# Patient Record
Sex: Female | Born: 1950
Health system: Southern US, Community
[De-identification: ages and names within clinical notes are randomized; demographics above are authoritative.]

## PROBLEM LIST (undated history)

## (undated) DIAGNOSIS — Z8719 Personal history of other diseases of the digestive system: Secondary | ICD-10-CM

## (undated) DIAGNOSIS — K449 Diaphragmatic hernia without obstruction or gangrene: Secondary | ICD-10-CM

## (undated) DIAGNOSIS — C541 Malignant neoplasm of endometrium: Secondary | ICD-10-CM

## (undated) DIAGNOSIS — K219 Gastro-esophageal reflux disease without esophagitis: Secondary | ICD-10-CM

## (undated) DIAGNOSIS — H269 Unspecified cataract: Secondary | ICD-10-CM

## (undated) DIAGNOSIS — K5900 Constipation, unspecified: Secondary | ICD-10-CM

## (undated) DIAGNOSIS — R232 Flushing: Secondary | ICD-10-CM

## (undated) DIAGNOSIS — K5792 Diverticulitis of intestine, part unspecified, without perforation or abscess without bleeding: Secondary | ICD-10-CM

## (undated) DIAGNOSIS — B019 Varicella without complication: Secondary | ICD-10-CM

## (undated) DIAGNOSIS — K635 Polyp of colon: Secondary | ICD-10-CM

## (undated) DIAGNOSIS — Z9889 Other specified postprocedural states: Secondary | ICD-10-CM

## (undated) DIAGNOSIS — K8020 Calculus of gallbladder without cholecystitis without obstruction: Secondary | ICD-10-CM

## (undated) DIAGNOSIS — M1990 Unspecified osteoarthritis, unspecified site: Secondary | ICD-10-CM

## (undated) DIAGNOSIS — C55 Malignant neoplasm of uterus, part unspecified: Secondary | ICD-10-CM

## (undated) DIAGNOSIS — R12 Heartburn: Secondary | ICD-10-CM

## (undated) DIAGNOSIS — K602 Anal fissure, unspecified: Secondary | ICD-10-CM

## (undated) DIAGNOSIS — R112 Nausea with vomiting, unspecified: Secondary | ICD-10-CM

## (undated) DIAGNOSIS — E785 Hyperlipidemia, unspecified: Secondary | ICD-10-CM

## (undated) HISTORY — DX: Calculus of gallbladder without cholecystitis without obstruction: K80.20

## (undated) HISTORY — DX: Personal history of other diseases of the digestive system: Z87.19

## (undated) HISTORY — DX: Polyp of colon: K63.5

## (undated) HISTORY — DX: Diverticulitis of intestine, part unspecified, without perforation or abscess without bleeding: K57.92

## (undated) HISTORY — DX: Hyperlipidemia, unspecified: E78.5

## (undated) HISTORY — PX: OTHER SURGICAL HISTORY: SHX169

## (undated) HISTORY — PX: UPPER GASTROINTESTINAL ENDOSCOPY: SHX188

## (undated) HISTORY — DX: Diaphragmatic hernia without obstruction or gangrene: K44.9

## (undated) HISTORY — PX: NASAL SINUS SURGERY: SHX719

## (undated) HISTORY — DX: Heartburn: R12

## (undated) HISTORY — PX: BREAST BIOPSY: SHX20

## (undated) HISTORY — DX: Anal fissure, unspecified: K60.2

## (undated) HISTORY — DX: Other specified postprocedural states: Z98.890

## (undated) HISTORY — DX: Unspecified osteoarthritis, unspecified site: M19.90

## (undated) HISTORY — DX: Malignant neoplasm of endometrium: C54.1

## (undated) HISTORY — DX: Malignant neoplasm of uterus, part unspecified: C55

## (undated) HISTORY — DX: Flushing: R23.2

## (undated) HISTORY — PX: BREAST CYST ASPIRATION: SHX578

## (undated) HISTORY — DX: Constipation, unspecified: K59.00

## (undated) HISTORY — DX: Varicella without complication: B01.9

## (undated) HISTORY — DX: Unspecified cataract: H26.9

## (undated) HISTORY — PX: POLYPECTOMY: SHX149

---

## 1971-04-13 HISTORY — PX: HEMORROIDECTOMY: SUR656

## 1982-04-12 HISTORY — PX: CHOLECYSTECTOMY: SHX55

## 1998-04-12 HISTORY — PX: TOTAL ABDOMINAL HYSTERECTOMY: SHX209

## 1998-04-12 HISTORY — PX: APPENDECTOMY: SHX54

## 2010-04-12 HISTORY — PX: REPLACEMENT TOTAL KNEE: SUR1224

## 2011-04-13 HISTORY — PX: REPLACEMENT TOTAL KNEE: SUR1224

## 2013-09-17 DIAGNOSIS — H359 Unspecified retinal disorder: Secondary | ICD-10-CM | POA: Diagnosis not present

## 2013-09-17 DIAGNOSIS — H2510 Age-related nuclear cataract, unspecified eye: Secondary | ICD-10-CM | POA: Diagnosis not present

## 2013-09-17 DIAGNOSIS — H18559 Macular corneal dystrophy, unspecified eye: Secondary | ICD-10-CM | POA: Diagnosis not present

## 2013-09-17 DIAGNOSIS — H18599 Other hereditary corneal dystrophies, unspecified eye: Secondary | ICD-10-CM | POA: Diagnosis not present

## 2013-11-01 DIAGNOSIS — K219 Gastro-esophageal reflux disease without esophagitis: Secondary | ICD-10-CM | POA: Diagnosis not present

## 2013-11-01 DIAGNOSIS — E782 Mixed hyperlipidemia: Secondary | ICD-10-CM | POA: Diagnosis not present

## 2013-11-01 DIAGNOSIS — L608 Other nail disorders: Secondary | ICD-10-CM | POA: Diagnosis not present

## 2013-11-01 DIAGNOSIS — M1710 Unilateral primary osteoarthritis, unspecified knee: Secondary | ICD-10-CM | POA: Diagnosis not present

## 2013-12-18 DIAGNOSIS — H2510 Age-related nuclear cataract, unspecified eye: Secondary | ICD-10-CM | POA: Diagnosis not present

## 2013-12-18 DIAGNOSIS — H18559 Macular corneal dystrophy, unspecified eye: Secondary | ICD-10-CM | POA: Diagnosis not present

## 2013-12-18 DIAGNOSIS — H359 Unspecified retinal disorder: Secondary | ICD-10-CM | POA: Diagnosis not present

## 2014-03-18 DIAGNOSIS — E782 Mixed hyperlipidemia: Secondary | ICD-10-CM | POA: Diagnosis not present

## 2014-03-18 DIAGNOSIS — Z0000 Encounter for general adult medical examination without abnormal findings: Secondary | ICD-10-CM | POA: Diagnosis not present

## 2014-03-18 DIAGNOSIS — Z23 Encounter for immunization: Secondary | ICD-10-CM | POA: Diagnosis not present

## 2014-03-18 DIAGNOSIS — Z1329 Encounter for screening for other suspected endocrine disorder: Secondary | ICD-10-CM | POA: Diagnosis not present

## 2014-05-01 DIAGNOSIS — H2513 Age-related nuclear cataract, bilateral: Secondary | ICD-10-CM | POA: Diagnosis not present

## 2014-05-01 DIAGNOSIS — H3589 Other specified retinal disorders: Secondary | ICD-10-CM | POA: Diagnosis not present

## 2014-05-01 DIAGNOSIS — H18453 Nodular corneal degeneration, bilateral: Secondary | ICD-10-CM | POA: Diagnosis not present

## 2014-05-23 DIAGNOSIS — Z779 Other contact with and (suspected) exposures hazardous to health: Secondary | ICD-10-CM | POA: Diagnosis not present

## 2014-05-23 DIAGNOSIS — L905 Scar conditions and fibrosis of skin: Secondary | ICD-10-CM | POA: Diagnosis not present

## 2014-05-23 DIAGNOSIS — Z803 Family history of malignant neoplasm of breast: Secondary | ICD-10-CM | POA: Diagnosis not present

## 2014-05-23 DIAGNOSIS — Z1231 Encounter for screening mammogram for malignant neoplasm of breast: Secondary | ICD-10-CM | POA: Diagnosis not present

## 2014-06-06 DIAGNOSIS — J219 Acute bronchiolitis, unspecified: Secondary | ICD-10-CM | POA: Diagnosis not present

## 2014-06-06 DIAGNOSIS — J209 Acute bronchitis, unspecified: Secondary | ICD-10-CM | POA: Diagnosis not present

## 2014-11-11 DIAGNOSIS — K5909 Other constipation: Secondary | ICD-10-CM | POA: Diagnosis not present

## 2014-11-11 DIAGNOSIS — F430 Acute stress reaction: Secondary | ICD-10-CM | POA: Diagnosis not present

## 2014-11-18 DIAGNOSIS — K5730 Diverticulosis of large intestine without perforation or abscess without bleeding: Secondary | ICD-10-CM | POA: Diagnosis not present

## 2014-11-18 DIAGNOSIS — K219 Gastro-esophageal reflux disease without esophagitis: Secondary | ICD-10-CM | POA: Diagnosis not present

## 2014-11-18 DIAGNOSIS — R1314 Dysphagia, pharyngoesophageal phase: Secondary | ICD-10-CM | POA: Diagnosis not present

## 2014-12-02 DIAGNOSIS — D120 Benign neoplasm of cecum: Secondary | ICD-10-CM | POA: Diagnosis not present

## 2014-12-02 DIAGNOSIS — K5900 Constipation, unspecified: Secondary | ICD-10-CM | POA: Diagnosis not present

## 2014-12-02 DIAGNOSIS — K621 Rectal polyp: Secondary | ICD-10-CM | POA: Diagnosis not present

## 2014-12-02 DIAGNOSIS — R1310 Dysphagia, unspecified: Secondary | ICD-10-CM | POA: Diagnosis not present

## 2014-12-02 DIAGNOSIS — R109 Unspecified abdominal pain: Secondary | ICD-10-CM | POA: Diagnosis not present

## 2014-12-02 DIAGNOSIS — R1031 Right lower quadrant pain: Secondary | ICD-10-CM | POA: Diagnosis not present

## 2014-12-02 DIAGNOSIS — K2950 Unspecified chronic gastritis without bleeding: Secondary | ICD-10-CM | POA: Diagnosis not present

## 2014-12-02 DIAGNOSIS — K209 Esophagitis, unspecified: Secondary | ICD-10-CM | POA: Diagnosis not present

## 2014-12-02 DIAGNOSIS — K210 Gastro-esophageal reflux disease with esophagitis: Secondary | ICD-10-CM | POA: Diagnosis not present

## 2014-12-02 DIAGNOSIS — K219 Gastro-esophageal reflux disease without esophagitis: Secondary | ICD-10-CM | POA: Diagnosis not present

## 2014-12-02 DIAGNOSIS — K635 Polyp of colon: Secondary | ICD-10-CM | POA: Diagnosis not present

## 2014-12-02 DIAGNOSIS — K5730 Diverticulosis of large intestine without perforation or abscess without bleeding: Secondary | ICD-10-CM | POA: Diagnosis not present

## 2015-01-27 DIAGNOSIS — E782 Mixed hyperlipidemia: Secondary | ICD-10-CM | POA: Diagnosis not present

## 2015-01-27 DIAGNOSIS — Z23 Encounter for immunization: Secondary | ICD-10-CM | POA: Diagnosis not present

## 2015-01-28 DIAGNOSIS — E782 Mixed hyperlipidemia: Secondary | ICD-10-CM | POA: Diagnosis not present

## 2015-01-28 LAB — LIPID PANEL
Cholesterol: 191 mg/dL (ref 0–200)
Cholesterol: 191 mg/dL (ref 0–200)
HDL: 48 mg/dL (ref 35–70)
HDL: 48 mg/dL (ref 35–70)
LDL Cholesterol: 121 mg/dL
LDL Cholesterol: 121 mg/dL
TRIGLYCERIDES: 110 mg/dL (ref 40–160)
Triglycerides: 110 mg/dL (ref 40–160)

## 2015-01-28 LAB — HEPATIC FUNCTION PANEL
ALK PHOS: 83 U/L (ref 25–125)
ALT: 13 U/L (ref 7–35)
ALT: 13 U/L (ref 7–35)
AST: 14 U/L (ref 13–35)
AST: 14 U/L (ref 13–35)
Alkaline Phosphatase: 83 U/L (ref 25–125)
BILIRUBIN, TOTAL: 0.6 mg/dL
Bilirubin, Direct: 0.1 mg/dL (ref 0.01–0.4)
Bilirubin, Direct: 0.1 mg/dL (ref 0.01–0.4)
Bilirubin, Total: 0.6 mg/dL

## 2015-03-13 DIAGNOSIS — L820 Inflamed seborrheic keratosis: Secondary | ICD-10-CM | POA: Diagnosis not present

## 2015-03-13 DIAGNOSIS — D492 Neoplasm of unspecified behavior of bone, soft tissue, and skin: Secondary | ICD-10-CM | POA: Diagnosis not present

## 2015-06-26 ENCOUNTER — Ambulatory Visit (INDEPENDENT_AMBULATORY_CARE_PROVIDER_SITE_OTHER): Payer: Medicare Other | Admitting: Family Medicine

## 2015-06-26 ENCOUNTER — Encounter: Payer: Self-pay | Admitting: Family Medicine

## 2015-06-26 VITALS — BP 126/88 | HR 77 | Temp 98.0°F | Ht 66.0 in | Wt 165.5 lb

## 2015-06-26 DIAGNOSIS — N951 Menopausal and female climacteric states: Secondary | ICD-10-CM

## 2015-06-26 DIAGNOSIS — E785 Hyperlipidemia, unspecified: Secondary | ICD-10-CM

## 2015-06-26 DIAGNOSIS — Z130 Encounter for screening for diseases of the blood and blood-forming organs and certain disorders involving the immune mechanism: Secondary | ICD-10-CM

## 2015-06-26 DIAGNOSIS — M170 Bilateral primary osteoarthritis of knee: Secondary | ICD-10-CM | POA: Diagnosis not present

## 2015-06-26 DIAGNOSIS — R232 Flushing: Secondary | ICD-10-CM

## 2015-06-26 DIAGNOSIS — Z23 Encounter for immunization: Secondary | ICD-10-CM

## 2015-06-26 DIAGNOSIS — K5900 Constipation, unspecified: Secondary | ICD-10-CM

## 2015-06-26 DIAGNOSIS — Z8542 Personal history of malignant neoplasm of other parts of uterus: Secondary | ICD-10-CM

## 2015-06-26 DIAGNOSIS — M1990 Unspecified osteoarthritis, unspecified site: Secondary | ICD-10-CM | POA: Insufficient documentation

## 2015-06-26 DIAGNOSIS — K219 Gastro-esophageal reflux disease without esophagitis: Secondary | ICD-10-CM

## 2015-06-26 NOTE — Patient Instructions (Signed)
Stop the amitiza.  Continue your other medications.   We will call your lab results.  Please be sure to get her mammogram.  Follow up annually  Take care  Dr. Lacinda Axon Health Maintenance, Female Adopting a healthy lifestyle and getting preventive care can go a long way to promote health and wellness. Talk with your health care provider about what schedule of regular examinations is right for you. This is a good chance for you to check in with your provider about disease prevention and staying healthy. In between checkups, there are plenty of things you can do on your own. Experts have done a lot of research about which lifestyle changes and preventive measures are most likely to keep you healthy. Ask your health care provider for more information. WEIGHT AND DIET  Eat a healthy diet  Be sure to include plenty of vegetables, fruits, low-fat dairy products, and lean protein.  Do not eat a lot of foods high in solid fats, added sugars, or salt.  Get regular exercise. This is one of the most important things you can do for your health.  Most adults should exercise for at least 150 minutes each week. The exercise should increase your heart rate and make you sweat (moderate-intensity exercise).  Most adults should also do strengthening exercises at least twice a week. This is in addition to the moderate-intensity exercise.  Maintain a healthy weight  Body mass index (BMI) is a measurement that can be used to identify possible weight problems. It estimates body fat based on height and weight. Your health care provider can help determine your BMI and help you achieve or maintain a healthy weight.  For females 71 years of age and older:   A BMI below 18.5 is considered underweight.  A BMI of 18.5 to 24.9 is normal.  A BMI of 25 to 29.9 is considered overweight.  A BMI of 30 and above is considered obese.  Watch levels of cholesterol and blood lipids  You should start having your blood  tested for lipids and cholesterol at 67 years of age, then have this test every 5 years.  You may need to have your cholesterol levels checked more often if:  Your lipid or cholesterol levels are high.  You are older than 67 years of age.  You are at high risk for heart disease.  CANCER SCREENING   Lung Cancer  Lung cancer screening is recommended for adults 31-39 years old who are at high risk for lung cancer because of a history of smoking.  A yearly low-dose CT scan of the lungs is recommended for people who:  Currently smoke.  Have quit within the past 15 years.  Have at least a 30-pack-year history of smoking. A pack year is smoking an average of one pack of cigarettes a day for 1 year.  Yearly screening should continue until it has been 15 years since you quit.  Yearly screening should stop if you develop a health problem that would prevent you from having lung cancer treatment.  Breast Cancer  Practice breast self-awareness. This [place] understanding how your breasts normally appear and feel.  It also [place] doing regular breast self-exams. Let your health care provider know about any changes, no matter how small.  If you are in your 20s or 30s, you should have a clinical breast exam (CBE) by a health care provider every 1-3 years as part of a regular health exam.  If you are 7 or older, have a  CBE every year. Also consider having a breast X-ray (mammogram) every year.  If you have a family history of breast cancer, talk to your health care provider about genetic screening.  If you are at high risk for breast cancer, talk to your health care provider about having an MRI and a mammogram every year.  Breast cancer gene (BRCA) assessment is recommended for women who have family members with BRCA-related cancers. BRCA-related cancers include:  Breast.  Ovarian.  Tubal.  Peritoneal cancers.  Results of the assessment will determine the need for genetic counseling  and BRCA1 and BRCA2 testing. Cervical Cancer Your health care provider may recommend that you be screened regularly for cancer of the pelvic organs (ovaries, uterus, and vagina). This screening involves a pelvic examination, including checking for microscopic changes to the surface of your cervix (Pap test). You may be encouraged to have this screening done every 3 years, beginning at age 41.  For women ages 36-65, health care providers may recommend pelvic exams and Pap testing every 3 years, or they may recommend the Pap and pelvic exam, combined with testing for human papilloma virus (HPV), every 5 years. Some types of HPV increase your risk of cervical cancer. Testing for HPV may also be done on women of any age with unclear Pap test results.  Other health care providers may not recommend any screening for nonpregnant women who are considered low risk for pelvic cancer and who do not have symptoms. Ask your health care provider if a screening pelvic exam is right for you.  If you have had past treatment for cervical cancer or a condition that could lead to cancer, you need Pap tests and screening for cancer for at least 20 years after your treatment. If Pap tests have been discontinued, your risk factors (such as having a new sexual partner) need to be reassessed to determine if screening should resume. Some women have medical problems that increase the chance of getting cervical cancer. In these cases, your health care provider may recommend more frequent screening and Pap tests. Colorectal Cancer  This type of cancer can be detected and often prevented.  Routine colorectal cancer screening usually begins at 67 years of age and continues through 67 years of age.  Your health care provider may recommend screening at an earlier age if you have risk factors for colon cancer.  Your health care provider may also recommend using home test kits to check for hidden blood in the stool.  A small camera  at the end of a tube can be used to examine your colon directly (sigmoidoscopy or colonoscopy). This is done to check for the earliest forms of colorectal cancer.  Routine screening usually begins at age 52.  Direct examination of the colon should be repeated every 5-10 years through 67 years of age. However, you may need to be screened more often if early forms of precancerous polyps or small growths are found. Skin Cancer  Check your skin from head to toe regularly.  Tell your health care provider about any new moles or changes in moles, especially if there is a change in a mole's shape or color.  Also tell your health care provider if you have a mole that is larger than the size of a pencil eraser.  Always use sunscreen. Apply sunscreen liberally and repeatedly throughout the day.  Protect yourself by wearing long sleeves, pants, a wide-brimmed hat, and sunglasses whenever you are outside. HEART DISEASE, DIABETES, AND HIGH BLOOD  PRESSURE   High blood pressure causes heart disease and increases the risk of stroke. High blood pressure is more likely to develop in:  People who have blood pressure in the high end of the normal range (130-139/85-89 mm Hg).  People who are overweight or obese.  People who are African American.  If you are 18-39 years of age, have your blood pressure checked every 3-5 years. If you are 40 years of age or older, have your blood pressure checked every year. You should have your blood pressure measured twice--once when you are at a hospital or clinic, and once when you are not at a hospital or clinic. Record the average of the two measurements. To check your blood pressure when you are not at a hospital or clinic, you can use:  An automated blood pressure machine at a pharmacy.  A home blood pressure monitor.  If you are between 55 years and 79 years old, ask your health care provider if you should take aspirin to prevent strokes.  Have regular diabetes  screenings. This involves taking a blood sample to check your fasting blood sugar level.  If you are at a normal weight and have a low risk for diabetes, have this test once every three years after 67 years of age.  If you are overweight and have a high risk for diabetes, consider being tested at a younger age or more often. PREVENTING INFECTION  Hepatitis B  If you have a higher risk for hepatitis B, you should be screened for this virus. You are considered at high risk for hepatitis B if:  You were born in a country where hepatitis B is common. Ask your health care provider which countries are considered high risk.  Your parents were born in a high-risk country, and you have not been immunized against hepatitis B (hepatitis B vaccine).  You have HIV or AIDS.  You use needles to inject street drugs.  You live with someone who has hepatitis B.  You have had sex with someone who has hepatitis B.  You get hemodialysis treatment.  You take certain medicines for conditions, including cancer, organ transplantation, and autoimmune conditions. Hepatitis C  Blood testing is recommended for:  Everyone born from 1945 through 1965.  Anyone with known risk factors for hepatitis C. Sexually transmitted infections (STIs)  You should be screened for sexually transmitted infections (STIs) including gonorrhea and chlamydia if:  You are sexually active and are younger than 67 years of age.  You are older than 67 years of age and your health care provider tells you that you are at risk for this type of infection.  Your sexual activity has changed since you were last screened and you are at an increased risk for chlamydia or gonorrhea. Ask your health care provider if you are at risk.  If you do not have HIV, but are at risk, it may be recommended that you take a prescription medicine daily to prevent HIV infection. This is called pre-exposure prophylaxis (PrEP). You are considered at risk  if:  You are sexually active and do not regularly use condoms or know the HIV status of your partner(s).  You take drugs by injection.  You are sexually active with a partner who has HIV. Talk with your health care provider about whether you are at high risk of being infected with HIV. If you choose to begin PrEP, you should first be tested for HIV. You should then be tested every 3   months for as long as you are taking PrEP.  PREGNANCY   If you are premenopausal and you may become pregnant, ask your health care provider about preconception counseling.  If you may become pregnant, take 400 to 800 micrograms (mcg) of folic acid every day.  If you want to prevent pregnancy, talk to your health care provider about birth control (contraception). OSTEOPOROSIS AND MENOPAUSE   Osteoporosis is a disease in which the bones lose minerals and strength with aging. This can result in serious bone fractures. Your risk for osteoporosis can be identified using a bone density scan.  If you are 47 years of age or older, or if you are at risk for osteoporosis and fractures, ask your health care provider if you should be screened.  Ask your health care provider whether you should take a calcium or vitamin D supplement to lower your risk for osteoporosis.  Menopause may have certain physical symptoms and risks.  Hormone replacement therapy may reduce some of these symptoms and risks. Talk to your health care provider about whether hormone replacement therapy is right for you.  HOME CARE INSTRUCTIONS   Schedule regular health, dental, and eye exams.  Stay current with your immunizations.   Do not use any tobacco products including cigarettes, chewing tobacco, or electronic cigarettes.  If you are pregnant, do not drink alcohol.  If you are breastfeeding, limit how much and how often you drink alcohol.  Limit alcohol intake to no more than 1 drink per day for nonpregnant women. One drink equals 12  ounces of beer, 5 ounces of wine, or 1 ounces of hard liquor.  Do not use street drugs.  Do not share needles.  Ask your health care provider for help if you need support or information about quitting drugs.  Tell your health care provider if you often feel depressed.  Tell your health care provider if you have ever been abused or do not feel safe at home.   This information is not intended to replace advice given to you by your health care provider. Make sure you discuss any questions you have with your health care provider.   Document Released: 10/12/2010 Document Revised: 04/19/2014 Document Reviewed: 02/28/2013 Elsevier Interactive Patient Education [employer].

## 2015-06-26 NOTE — Progress Notes (Signed)
Pre visit review using our clinic review tool, if applicable. No additional management support is needed unless otherwise documented below in the visit note. 

## 2015-06-27 ENCOUNTER — Encounter: Payer: Self-pay | Admitting: Family Medicine

## 2015-06-27 DIAGNOSIS — K5900 Constipation, unspecified: Secondary | ICD-10-CM | POA: Insufficient documentation

## 2015-06-27 DIAGNOSIS — E785 Hyperlipidemia, unspecified: Secondary | ICD-10-CM | POA: Insufficient documentation

## 2015-06-27 DIAGNOSIS — K5909 Other constipation: Secondary | ICD-10-CM | POA: Insufficient documentation

## 2015-06-27 DIAGNOSIS — K219 Gastro-esophageal reflux disease without esophagitis: Secondary | ICD-10-CM | POA: Insufficient documentation

## 2015-06-27 DIAGNOSIS — R232 Flushing: Secondary | ICD-10-CM | POA: Insufficient documentation

## 2015-06-27 LAB — CBC
HCT: 44.7 % (ref 36.0–46.0)
Hemoglobin: 15.1 g/dL — ABNORMAL HIGH (ref 12.0–15.0)
MCHC: 33.8 g/dL (ref 30.0–36.0)
MCV: 89 fl (ref 78.0–100.0)
PLATELETS: 206 10*3/uL (ref 150.0–400.0)
RBC: 5.02 Mil/uL (ref 3.87–5.11)
RDW: 12.3 % (ref 11.5–15.5)
WBC: 4.9 10*3/uL (ref 4.0–10.5)

## 2015-06-27 LAB — COMPREHENSIVE METABOLIC PANEL
ALT: 17 U/L (ref 0–35)
AST: 15 U/L (ref 0–37)
Albumin: 4.4 g/dL (ref 3.5–5.2)
Alkaline Phosphatase: 80 U/L (ref 39–117)
BILIRUBIN TOTAL: 0.6 mg/dL (ref 0.2–1.2)
BUN: 9 mg/dL (ref 6–23)
CALCIUM: 9.7 mg/dL (ref 8.4–10.5)
CHLORIDE: 101 meq/L (ref 96–112)
CO2: 33 meq/L — AB (ref 19–32)
CREATININE: 0.75 mg/dL (ref 0.40–1.20)
GFR: 81.99 mL/min (ref >60.00)
GLUCOSE: 92 mg/dL (ref 70–99)
Potassium: 3.8 mEq/L (ref 3.5–5.1)
SODIUM: 141 meq/L (ref 135–145)
Total Protein: 7.2 g/dL (ref 6.0–8.3)

## 2015-06-27 LAB — LIPID PANEL
CHOL/HDL RATIO: 4
Cholesterol: 189 mg/dL (ref 0–200)
HDL: 50.4 mg/dL (ref >39.00)
LDL CALC: 112 mg/dL — AB (ref 0–99)
NONHDL: 138.28
TRIGLYCERIDES: 129 mg/dL (ref 0.0–149.0)
VLDL: 25.8 mg/dL (ref 0.0–40.0)

## 2015-06-27 NOTE — Assessment & Plan Note (Signed)
Stable. Discussed risks of hormone replacement therapy. Patient is aware the risk and would like to continue. I encouraged the patient continue cutting back.

## 2015-06-27 NOTE — Assessment & Plan Note (Signed)
Improving. I suggested a trial off her Amitiza.

## 2015-06-27 NOTE — Assessment & Plan Note (Signed)
Stable on Omeprazole.  

## 2015-06-27 NOTE — Assessment & Plan Note (Signed)
In need of better control. Checking lipid panel today. Discussed switch to high potency statin and patient refused.

## 2015-06-27 NOTE — Assessment & Plan Note (Signed)
S/p hysterectomy and oophorectomy. Patient was monitored for 5 years. No need for additional Pap smears at this time.

## 2015-06-27 NOTE — Progress Notes (Signed)
Subjective:  Patient ID: Jessica Chavez, female    DOB: [birth year]  Age: 65 y.o. MRN: [id number]  CC: Establish care  HPI Rosalina Dingwall Totzke is a 65 y.o. female presents to the clinic today to establish care. Issues are below.  HLD  Patient has her most recent labs.   Cholesterol not at goal.  Currently on pravastatin.  Will discuss today.  Hot flashes  Stable on estrogen patch.  Has been cutting back slowly but does not want to come off.  Constipation  Patient has had a issue with constipation since her husband died.  She has recently had improvement and would like to discuss stopping her current medication (Amitiza).  GERD  Stable/well controlled on omeprazole.  PMH, Surgical Hx, Family Hx, Social History reviewed and updated as below.  Past Medical History  Diagnosis Date  . Osteoarthritis   . Chicken pox   . Uterine cancer ([place])   . Diverticulitis   . Hyperlipidemia   . Heartburn   . Colon polyps   . Constipation   . Hot flashes    Past Surgical History  Procedure Laterality Date  . Cholecystectomy  1984  . Breast biopsy    . Appendectomy  2000  . Abdominal hysterectomy  2000  . Oophorectomy    . Replacement total knee Left 2012  . Replacement total knee Right 2013  . Hemorroidectomy  1973   Family History  Problem Relation Age of Onset  . Endometriosis Mother   . Breast cancer Mother   . Hyperlipidemia Mother   . Hyperlipidemia Father   . Heart disease Father   . Heart disease Maternal Grandmother   . Heart disease Maternal Grandfather   . Hypertension Father   . Lymphoma Mother   . Osteoarthritis Mother   . COPD Mother   . Cataracts Mother   . Glaucoma Mother    Social History  Substance Use Topics  . Smoking status: Never Smoker   . Smokeless tobacco: Never Used  . Alcohol Use: No   Review of Systems  Eyes: Positive for visual disturbance.  Skin:       Mole.  All other systems reviewed and are negative.   Objective:    Today's Vitals: BP 126/88 mmHg  Pulse 77  Temp(Src) 98 F (36.7 C) (Oral)  Ht _0  (1.676 m)  Wt 165 lb 8 oz (75.07 kg)  BMI 26.73 kg/m2  SpO2 98%  Physical Exam  Constitutional: She is oriented to person, place, and time. She appears well-developed and well-nourished. No distress.  HENT:  Head: Normocephalic and atraumatic.  Nose: Nose normal.  Mouth/Throat: Oropharynx is clear and moist. No oropharyngeal exudate.  Normal TM's bilaterally.   Eyes: Conjunctivae are normal. No scleral icterus.  Neck: Neck supple. No thyromegaly present.  Cardiovascular: Normal rate and regular rhythm.   No murmur heard. Pulmonary/Chest: Effort normal and breath sounds normal. She has no wheezes. She has no rales.  Abdominal: Soft. She exhibits no distension. There is no tenderness. There is no rebound and no guarding.  Musculoskeletal: Normal range of motion. She exhibits no edema.  Lymphadenopathy:    She has no cervical adenopathy.  Neurological: She is alert and oriented to person, place, and time.  Skin: Skin is warm and dry. No rash noted.  Psychiatric:  Flat affect.  Vitals reviewed.   Assessment & Plan:   Problem List Items Addressed This Visit    Osteoarthritis   Hyperlipidemia - Primary  In need of better control. Checking lipid panel today. Discussed switch to high potency statin and patient refused.      Relevant Medications   pravastatin (PRAVACHOL) 20 MG tablet   Other Relevant Orders   Lipid Profile (Completed)   Hot flashes    Stable. Discussed risks of hormone replacement therapy. Patient is aware the risk and would like to continue. I encouraged the patient continue cutting back.      History of uterine cancer    S/p hysterectomy and oophorectomy. Patient was monitored for 5 years. No need for additional Pap smears at this time.      GERD (gastroesophageal reflux disease)    Stable on Omeprazole.       Relevant Medications   AMITIZA 8 MCG capsule    omeprazole (PRILOSEC) 20 MG capsule   Constipation    Improving. I suggested a trial off her Amitiza.       Relevant Orders   Comp Met (CMET) (Completed)    Other Visit Diagnoses    Screening for deficiency anemia        Relevant Orders    CBC (Completed)    Need for prophylactic vaccination against Streptococcus pneumoniae (pneumococcus)        Relevant Orders    Pneumococcal conjugate vaccine 13-valent (Completed)       Outpatient Encounter Prescriptions as of 06/26/2015  Medication Sig  . AMITIZA 8 MCG capsule Take 8 mcg by mouth once.  . Calcium Carbonate-Vit D-Min (CALCIUM 1200 PO) Take by mouth.  . Cyanocobalamin (B-12 PO) Take by mouth.  . estradiol (VIVELLE-DOT) 0.025 MG/24HR Place 1 patch onto the skin 2 (two) times a week. Patient is only taking once a week.  . LUTEIN PO Take by mouth.  . Multiple Vitamin (MULTIVITAMIN) tablet Take 1 tablet by mouth daily.  Marland Kitchen omeprazole (PRILOSEC) 20 MG capsule Take 20 mg by mouth daily.  . pravastatin (PRAVACHOL) 20 MG tablet Take 20 mg by mouth once.   No facility-administered encounter medications on file as of 06/26/2015.    Follow-up: 1 year  [place]

## 2015-06-30 ENCOUNTER — Encounter: Payer: Self-pay | Admitting: Family Medicine

## 2015-07-10 ENCOUNTER — Other Ambulatory Visit: Payer: Self-pay | Admitting: Family Medicine

## 2015-07-10 ENCOUNTER — Encounter: Payer: Self-pay | Admitting: Family Medicine

## 2015-07-10 MED ORDER — AMITIZA 8 MCG PO CAPS: 8.0000 ug | ORAL_CAPSULE | Freq: Once | ORAL | Status: DC

## 2015-07-10 MED ORDER — PRAVASTATIN SODIUM 20 MG PO TABS: 20.0000 mg | ORAL_TABLET | Freq: Once | ORAL | Status: DC

## 2015-07-14 ENCOUNTER — Other Ambulatory Visit: Payer: Self-pay

## 2015-07-14 MED ORDER — AMITIZA 8 MCG PO CAPS: 8.0000 ug | ORAL_CAPSULE | Freq: Every day | ORAL | Status: DC

## 2015-07-14 MED ORDER — PRAVASTATIN SODIUM 20 MG PO TABS: 20.0000 mg | ORAL_TABLET | Freq: Every day | ORAL | Status: DC

## 2015-08-05 DIAGNOSIS — Z6826 Body mass index (BMI) 26.0-26.9, adult: Secondary | ICD-10-CM | POA: Diagnosis not present

## 2015-08-05 DIAGNOSIS — Z124 Encounter for screening for malignant neoplasm of cervix: Secondary | ICD-10-CM | POA: Diagnosis not present

## 2015-08-05 DIAGNOSIS — Z01419 Encounter for gynecological examination (general) (routine) without abnormal findings: Secondary | ICD-10-CM | POA: Diagnosis not present

## 2015-08-12 ENCOUNTER — Encounter: Payer: Self-pay | Admitting: Family Medicine

## 2015-08-14 DIAGNOSIS — N958 Other specified menopausal and perimenopausal disorders: Secondary | ICD-10-CM | POA: Diagnosis not present

## 2015-08-14 DIAGNOSIS — Z1231 Encounter for screening mammogram for malignant neoplasm of breast: Secondary | ICD-10-CM | POA: Diagnosis not present

## 2015-08-25 DIAGNOSIS — M5441 Lumbago with sciatica, right side: Secondary | ICD-10-CM | POA: Diagnosis not present

## 2015-08-25 DIAGNOSIS — M25561 Pain in right knee: Secondary | ICD-10-CM | POA: Diagnosis not present

## 2015-08-25 DIAGNOSIS — M25562 Pain in left knee: Secondary | ICD-10-CM | POA: Diagnosis not present

## 2015-09-29 DIAGNOSIS — M8580 Other specified disorders of bone density and structure, unspecified site: Secondary | ICD-10-CM | POA: Diagnosis not present

## 2015-09-29 DIAGNOSIS — M791 Myalgia: Secondary | ICD-10-CM | POA: Diagnosis not present

## 2015-09-29 DIAGNOSIS — M5431 Sciatica, right side: Secondary | ICD-10-CM | POA: Diagnosis not present

## 2015-09-29 DIAGNOSIS — R5383 Other fatigue: Secondary | ICD-10-CM | POA: Diagnosis not present

## 2015-09-29 DIAGNOSIS — M9902 Segmental and somatic dysfunction of thoracic region: Secondary | ICD-10-CM | POA: Diagnosis not present

## 2015-09-29 DIAGNOSIS — E559 Vitamin D deficiency, unspecified: Secondary | ICD-10-CM | POA: Diagnosis not present

## 2015-09-29 DIAGNOSIS — M9905 Segmental and somatic dysfunction of pelvic region: Secondary | ICD-10-CM | POA: Diagnosis not present

## 2015-09-29 DIAGNOSIS — M9903 Segmental and somatic dysfunction of lumbar region: Secondary | ICD-10-CM | POA: Diagnosis not present

## 2015-09-29 DIAGNOSIS — M62830 Muscle spasm of back: Secondary | ICD-10-CM | POA: Diagnosis not present

## 2015-10-01 DIAGNOSIS — M62830 Muscle spasm of back: Secondary | ICD-10-CM | POA: Diagnosis not present

## 2015-10-01 DIAGNOSIS — M9902 Segmental and somatic dysfunction of thoracic region: Secondary | ICD-10-CM | POA: Diagnosis not present

## 2015-10-01 DIAGNOSIS — M5431 Sciatica, right side: Secondary | ICD-10-CM | POA: Diagnosis not present

## 2015-10-01 DIAGNOSIS — M9903 Segmental and somatic dysfunction of lumbar region: Secondary | ICD-10-CM | POA: Diagnosis not present

## 2015-10-01 DIAGNOSIS — M9905 Segmental and somatic dysfunction of pelvic region: Secondary | ICD-10-CM | POA: Diagnosis not present

## 2015-10-01 DIAGNOSIS — M791 Myalgia: Secondary | ICD-10-CM | POA: Diagnosis not present

## 2015-10-03 DIAGNOSIS — M5431 Sciatica, right side: Secondary | ICD-10-CM | POA: Diagnosis not present

## 2015-10-03 DIAGNOSIS — M9903 Segmental and somatic dysfunction of lumbar region: Secondary | ICD-10-CM | POA: Diagnosis not present

## 2015-10-03 DIAGNOSIS — M9902 Segmental and somatic dysfunction of thoracic region: Secondary | ICD-10-CM | POA: Diagnosis not present

## 2015-10-03 DIAGNOSIS — M9905 Segmental and somatic dysfunction of pelvic region: Secondary | ICD-10-CM | POA: Diagnosis not present

## 2015-10-03 DIAGNOSIS — M62830 Muscle spasm of back: Secondary | ICD-10-CM | POA: Diagnosis not present

## 2015-10-03 DIAGNOSIS — M791 Myalgia: Secondary | ICD-10-CM | POA: Diagnosis not present

## 2015-10-06 DIAGNOSIS — M5431 Sciatica, right side: Secondary | ICD-10-CM | POA: Diagnosis not present

## 2015-10-06 DIAGNOSIS — M62830 Muscle spasm of back: Secondary | ICD-10-CM | POA: Diagnosis not present

## 2015-10-06 DIAGNOSIS — M9902 Segmental and somatic dysfunction of thoracic region: Secondary | ICD-10-CM | POA: Diagnosis not present

## 2015-10-06 DIAGNOSIS — M9903 Segmental and somatic dysfunction of lumbar region: Secondary | ICD-10-CM | POA: Diagnosis not present

## 2015-10-06 DIAGNOSIS — M791 Myalgia: Secondary | ICD-10-CM | POA: Diagnosis not present

## 2015-10-06 DIAGNOSIS — M9905 Segmental and somatic dysfunction of pelvic region: Secondary | ICD-10-CM | POA: Diagnosis not present

## 2015-10-08 DIAGNOSIS — M5431 Sciatica, right side: Secondary | ICD-10-CM | POA: Diagnosis not present

## 2015-10-08 DIAGNOSIS — M9905 Segmental and somatic dysfunction of pelvic region: Secondary | ICD-10-CM | POA: Diagnosis not present

## 2015-10-08 DIAGNOSIS — M9902 Segmental and somatic dysfunction of thoracic region: Secondary | ICD-10-CM | POA: Diagnosis not present

## 2015-10-08 DIAGNOSIS — M9903 Segmental and somatic dysfunction of lumbar region: Secondary | ICD-10-CM | POA: Diagnosis not present

## 2015-10-08 DIAGNOSIS — M62830 Muscle spasm of back: Secondary | ICD-10-CM | POA: Diagnosis not present

## 2015-10-08 DIAGNOSIS — M791 Myalgia: Secondary | ICD-10-CM | POA: Diagnosis not present

## 2015-10-10 DIAGNOSIS — M791 Myalgia: Secondary | ICD-10-CM | POA: Diagnosis not present

## 2015-10-10 DIAGNOSIS — M5431 Sciatica, right side: Secondary | ICD-10-CM | POA: Diagnosis not present

## 2015-10-10 DIAGNOSIS — M62830 Muscle spasm of back: Secondary | ICD-10-CM | POA: Diagnosis not present

## 2015-10-10 DIAGNOSIS — M9903 Segmental and somatic dysfunction of lumbar region: Secondary | ICD-10-CM | POA: Diagnosis not present

## 2015-10-10 DIAGNOSIS — M9905 Segmental and somatic dysfunction of pelvic region: Secondary | ICD-10-CM | POA: Diagnosis not present

## 2015-10-10 DIAGNOSIS — M9902 Segmental and somatic dysfunction of thoracic region: Secondary | ICD-10-CM | POA: Diagnosis not present

## 2015-10-13 DIAGNOSIS — M791 Myalgia: Secondary | ICD-10-CM | POA: Diagnosis not present

## 2015-10-13 DIAGNOSIS — M9902 Segmental and somatic dysfunction of thoracic region: Secondary | ICD-10-CM | POA: Diagnosis not present

## 2015-10-13 DIAGNOSIS — M62830 Muscle spasm of back: Secondary | ICD-10-CM | POA: Diagnosis not present

## 2015-10-13 DIAGNOSIS — M9903 Segmental and somatic dysfunction of lumbar region: Secondary | ICD-10-CM | POA: Diagnosis not present

## 2015-10-13 DIAGNOSIS — M9905 Segmental and somatic dysfunction of pelvic region: Secondary | ICD-10-CM | POA: Diagnosis not present

## 2015-10-13 DIAGNOSIS — M5431 Sciatica, right side: Secondary | ICD-10-CM | POA: Diagnosis not present

## 2015-10-15 DIAGNOSIS — M791 Myalgia: Secondary | ICD-10-CM | POA: Diagnosis not present

## 2015-10-15 DIAGNOSIS — M5431 Sciatica, right side: Secondary | ICD-10-CM | POA: Diagnosis not present

## 2015-10-15 DIAGNOSIS — M9902 Segmental and somatic dysfunction of thoracic region: Secondary | ICD-10-CM | POA: Diagnosis not present

## 2015-10-15 DIAGNOSIS — M62830 Muscle spasm of back: Secondary | ICD-10-CM | POA: Diagnosis not present

## 2015-10-15 DIAGNOSIS — M9905 Segmental and somatic dysfunction of pelvic region: Secondary | ICD-10-CM | POA: Diagnosis not present

## 2015-10-15 DIAGNOSIS — M9903 Segmental and somatic dysfunction of lumbar region: Secondary | ICD-10-CM | POA: Diagnosis not present

## 2015-10-20 ENCOUNTER — Encounter: Payer: Self-pay | Admitting: Family Medicine

## 2015-10-20 ENCOUNTER — Other Ambulatory Visit: Payer: Self-pay | Admitting: Family Medicine

## 2015-10-20 DIAGNOSIS — M62830 Muscle spasm of back: Secondary | ICD-10-CM | POA: Diagnosis not present

## 2015-10-20 DIAGNOSIS — M9905 Segmental and somatic dysfunction of pelvic region: Secondary | ICD-10-CM | POA: Diagnosis not present

## 2015-10-20 DIAGNOSIS — M5431 Sciatica, right side: Secondary | ICD-10-CM | POA: Diagnosis not present

## 2015-10-20 DIAGNOSIS — M9903 Segmental and somatic dysfunction of lumbar region: Secondary | ICD-10-CM | POA: Diagnosis not present

## 2015-10-20 DIAGNOSIS — M791 Myalgia: Secondary | ICD-10-CM | POA: Diagnosis not present

## 2015-10-20 DIAGNOSIS — M9902 Segmental and somatic dysfunction of thoracic region: Secondary | ICD-10-CM | POA: Diagnosis not present

## 2015-10-20 MED ORDER — OMEPRAZOLE 20 MG PO CPDR: 20.0000 mg | DELAYED_RELEASE_CAPSULE | Freq: Every day | ORAL | Status: DC

## 2015-10-20 NOTE — Telephone Encounter (Signed)
Historical medication

## 2015-10-23 DIAGNOSIS — M9902 Segmental and somatic dysfunction of thoracic region: Secondary | ICD-10-CM | POA: Diagnosis not present

## 2015-10-23 DIAGNOSIS — M62830 Muscle spasm of back: Secondary | ICD-10-CM | POA: Diagnosis not present

## 2015-10-23 DIAGNOSIS — M5431 Sciatica, right side: Secondary | ICD-10-CM | POA: Diagnosis not present

## 2015-10-23 DIAGNOSIS — M9905 Segmental and somatic dysfunction of pelvic region: Secondary | ICD-10-CM | POA: Diagnosis not present

## 2015-10-23 DIAGNOSIS — M9903 Segmental and somatic dysfunction of lumbar region: Secondary | ICD-10-CM | POA: Diagnosis not present

## 2015-10-23 DIAGNOSIS — M791 Myalgia: Secondary | ICD-10-CM | POA: Diagnosis not present

## 2015-10-27 DIAGNOSIS — M9902 Segmental and somatic dysfunction of thoracic region: Secondary | ICD-10-CM | POA: Diagnosis not present

## 2015-10-27 DIAGNOSIS — M5431 Sciatica, right side: Secondary | ICD-10-CM | POA: Diagnosis not present

## 2015-10-27 DIAGNOSIS — M9905 Segmental and somatic dysfunction of pelvic region: Secondary | ICD-10-CM | POA: Diagnosis not present

## 2015-10-27 DIAGNOSIS — M62830 Muscle spasm of back: Secondary | ICD-10-CM | POA: Diagnosis not present

## 2015-10-27 DIAGNOSIS — M791 Myalgia: Secondary | ICD-10-CM | POA: Diagnosis not present

## 2015-10-27 DIAGNOSIS — M9903 Segmental and somatic dysfunction of lumbar region: Secondary | ICD-10-CM | POA: Diagnosis not present

## 2015-10-30 DIAGNOSIS — M9903 Segmental and somatic dysfunction of lumbar region: Secondary | ICD-10-CM | POA: Diagnosis not present

## 2015-10-30 DIAGNOSIS — M5431 Sciatica, right side: Secondary | ICD-10-CM | POA: Diagnosis not present

## 2015-10-30 DIAGNOSIS — M791 Myalgia: Secondary | ICD-10-CM | POA: Diagnosis not present

## 2015-10-30 DIAGNOSIS — M62830 Muscle spasm of back: Secondary | ICD-10-CM | POA: Diagnosis not present

## 2015-10-30 DIAGNOSIS — M9902 Segmental and somatic dysfunction of thoracic region: Secondary | ICD-10-CM | POA: Diagnosis not present

## 2015-10-30 DIAGNOSIS — M9905 Segmental and somatic dysfunction of pelvic region: Secondary | ICD-10-CM | POA: Diagnosis not present

## 2015-11-03 DIAGNOSIS — M9905 Segmental and somatic dysfunction of pelvic region: Secondary | ICD-10-CM | POA: Diagnosis not present

## 2015-11-03 DIAGNOSIS — M9902 Segmental and somatic dysfunction of thoracic region: Secondary | ICD-10-CM | POA: Diagnosis not present

## 2015-11-03 DIAGNOSIS — M5431 Sciatica, right side: Secondary | ICD-10-CM | POA: Diagnosis not present

## 2015-11-03 DIAGNOSIS — M62830 Muscle spasm of back: Secondary | ICD-10-CM | POA: Diagnosis not present

## 2015-11-03 DIAGNOSIS — M9903 Segmental and somatic dysfunction of lumbar region: Secondary | ICD-10-CM | POA: Diagnosis not present

## 2015-11-03 DIAGNOSIS — M791 Myalgia: Secondary | ICD-10-CM | POA: Diagnosis not present

## 2015-11-06 DIAGNOSIS — M9903 Segmental and somatic dysfunction of lumbar region: Secondary | ICD-10-CM | POA: Diagnosis not present

## 2015-11-06 DIAGNOSIS — M9902 Segmental and somatic dysfunction of thoracic region: Secondary | ICD-10-CM | POA: Diagnosis not present

## 2015-11-06 DIAGNOSIS — M62830 Muscle spasm of back: Secondary | ICD-10-CM | POA: Diagnosis not present

## 2015-11-06 DIAGNOSIS — M5431 Sciatica, right side: Secondary | ICD-10-CM | POA: Diagnosis not present

## 2015-11-06 DIAGNOSIS — M9905 Segmental and somatic dysfunction of pelvic region: Secondary | ICD-10-CM | POA: Diagnosis not present

## 2015-11-06 DIAGNOSIS — M791 Myalgia: Secondary | ICD-10-CM | POA: Diagnosis not present

## 2015-11-11 DIAGNOSIS — M9905 Segmental and somatic dysfunction of pelvic region: Secondary | ICD-10-CM | POA: Diagnosis not present

## 2015-11-11 DIAGNOSIS — M9903 Segmental and somatic dysfunction of lumbar region: Secondary | ICD-10-CM | POA: Diagnosis not present

## 2015-11-11 DIAGNOSIS — M5431 Sciatica, right side: Secondary | ICD-10-CM | POA: Diagnosis not present

## 2015-11-11 DIAGNOSIS — M791 Myalgia: Secondary | ICD-10-CM | POA: Diagnosis not present

## 2015-11-11 DIAGNOSIS — M9902 Segmental and somatic dysfunction of thoracic region: Secondary | ICD-10-CM | POA: Diagnosis not present

## 2015-11-11 DIAGNOSIS — M62830 Muscle spasm of back: Secondary | ICD-10-CM | POA: Diagnosis not present

## 2015-11-17 DIAGNOSIS — M62830 Muscle spasm of back: Secondary | ICD-10-CM | POA: Diagnosis not present

## 2015-11-17 DIAGNOSIS — M791 Myalgia: Secondary | ICD-10-CM | POA: Diagnosis not present

## 2015-11-17 DIAGNOSIS — M9902 Segmental and somatic dysfunction of thoracic region: Secondary | ICD-10-CM | POA: Diagnosis not present

## 2015-11-17 DIAGNOSIS — M5431 Sciatica, right side: Secondary | ICD-10-CM | POA: Diagnosis not present

## 2015-11-17 DIAGNOSIS — M9905 Segmental and somatic dysfunction of pelvic region: Secondary | ICD-10-CM | POA: Diagnosis not present

## 2015-11-17 DIAGNOSIS — M9903 Segmental and somatic dysfunction of lumbar region: Secondary | ICD-10-CM | POA: Diagnosis not present

## 2015-11-24 DIAGNOSIS — M791 Myalgia: Secondary | ICD-10-CM | POA: Diagnosis not present

## 2015-11-24 DIAGNOSIS — M5431 Sciatica, right side: Secondary | ICD-10-CM | POA: Diagnosis not present

## 2015-11-24 DIAGNOSIS — M9902 Segmental and somatic dysfunction of thoracic region: Secondary | ICD-10-CM | POA: Diagnosis not present

## 2015-11-24 DIAGNOSIS — M9903 Segmental and somatic dysfunction of lumbar region: Secondary | ICD-10-CM | POA: Diagnosis not present

## 2015-11-24 DIAGNOSIS — M62830 Muscle spasm of back: Secondary | ICD-10-CM | POA: Diagnosis not present

## 2015-11-24 DIAGNOSIS — M9905 Segmental and somatic dysfunction of pelvic region: Secondary | ICD-10-CM | POA: Diagnosis not present

## 2015-12-05 ENCOUNTER — Other Ambulatory Visit: Payer: Self-pay | Admitting: Family Medicine

## 2015-12-09 DIAGNOSIS — M5431 Sciatica, right side: Secondary | ICD-10-CM | POA: Diagnosis not present

## 2015-12-09 DIAGNOSIS — M791 Myalgia: Secondary | ICD-10-CM | POA: Diagnosis not present

## 2015-12-09 DIAGNOSIS — M9902 Segmental and somatic dysfunction of thoracic region: Secondary | ICD-10-CM | POA: Diagnosis not present

## 2015-12-09 DIAGNOSIS — M9905 Segmental and somatic dysfunction of pelvic region: Secondary | ICD-10-CM | POA: Diagnosis not present

## 2015-12-09 DIAGNOSIS — M9903 Segmental and somatic dysfunction of lumbar region: Secondary | ICD-10-CM | POA: Diagnosis not present

## 2015-12-09 DIAGNOSIS — M62830 Muscle spasm of back: Secondary | ICD-10-CM | POA: Diagnosis not present

## 2015-12-22 DIAGNOSIS — M62830 Muscle spasm of back: Secondary | ICD-10-CM | POA: Diagnosis not present

## 2015-12-22 DIAGNOSIS — M9901 Segmental and somatic dysfunction of cervical region: Secondary | ICD-10-CM | POA: Diagnosis not present

## 2015-12-22 DIAGNOSIS — R51 Headache: Secondary | ICD-10-CM | POA: Diagnosis not present

## 2015-12-22 DIAGNOSIS — M5431 Sciatica, right side: Secondary | ICD-10-CM | POA: Diagnosis not present

## 2015-12-22 DIAGNOSIS — M9902 Segmental and somatic dysfunction of thoracic region: Secondary | ICD-10-CM | POA: Diagnosis not present

## 2015-12-22 DIAGNOSIS — M9903 Segmental and somatic dysfunction of lumbar region: Secondary | ICD-10-CM | POA: Diagnosis not present

## 2015-12-22 DIAGNOSIS — M9905 Segmental and somatic dysfunction of pelvic region: Secondary | ICD-10-CM | POA: Diagnosis not present

## 2015-12-22 DIAGNOSIS — M791 Myalgia: Secondary | ICD-10-CM | POA: Diagnosis not present

## 2015-12-25 DIAGNOSIS — R51 Headache: Secondary | ICD-10-CM | POA: Diagnosis not present

## 2015-12-25 DIAGNOSIS — M9902 Segmental and somatic dysfunction of thoracic region: Secondary | ICD-10-CM | POA: Diagnosis not present

## 2015-12-25 DIAGNOSIS — M5431 Sciatica, right side: Secondary | ICD-10-CM | POA: Diagnosis not present

## 2015-12-25 DIAGNOSIS — M9905 Segmental and somatic dysfunction of pelvic region: Secondary | ICD-10-CM | POA: Diagnosis not present

## 2015-12-25 DIAGNOSIS — M62830 Muscle spasm of back: Secondary | ICD-10-CM | POA: Diagnosis not present

## 2015-12-25 DIAGNOSIS — M791 Myalgia: Secondary | ICD-10-CM | POA: Diagnosis not present

## 2015-12-25 DIAGNOSIS — M9903 Segmental and somatic dysfunction of lumbar region: Secondary | ICD-10-CM | POA: Diagnosis not present

## 2015-12-25 DIAGNOSIS — M9901 Segmental and somatic dysfunction of cervical region: Secondary | ICD-10-CM | POA: Diagnosis not present

## 2015-12-31 DIAGNOSIS — M9902 Segmental and somatic dysfunction of thoracic region: Secondary | ICD-10-CM | POA: Diagnosis not present

## 2015-12-31 DIAGNOSIS — M791 Myalgia: Secondary | ICD-10-CM | POA: Diagnosis not present

## 2015-12-31 DIAGNOSIS — M9905 Segmental and somatic dysfunction of pelvic region: Secondary | ICD-10-CM | POA: Diagnosis not present

## 2015-12-31 DIAGNOSIS — M5431 Sciatica, right side: Secondary | ICD-10-CM | POA: Diagnosis not present

## 2015-12-31 DIAGNOSIS — R51 Headache: Secondary | ICD-10-CM | POA: Diagnosis not present

## 2015-12-31 DIAGNOSIS — M62830 Muscle spasm of back: Secondary | ICD-10-CM | POA: Diagnosis not present

## 2015-12-31 DIAGNOSIS — M9903 Segmental and somatic dysfunction of lumbar region: Secondary | ICD-10-CM | POA: Diagnosis not present

## 2016-01-13 DIAGNOSIS — M9902 Segmental and somatic dysfunction of thoracic region: Secondary | ICD-10-CM | POA: Diagnosis not present

## 2016-01-13 DIAGNOSIS — R51 Headache: Secondary | ICD-10-CM | POA: Diagnosis not present

## 2016-01-13 DIAGNOSIS — M791 Myalgia: Secondary | ICD-10-CM | POA: Diagnosis not present

## 2016-01-13 DIAGNOSIS — M9903 Segmental and somatic dysfunction of lumbar region: Secondary | ICD-10-CM | POA: Diagnosis not present

## 2016-01-13 DIAGNOSIS — M9905 Segmental and somatic dysfunction of pelvic region: Secondary | ICD-10-CM | POA: Diagnosis not present

## 2016-01-13 DIAGNOSIS — M62830 Muscle spasm of back: Secondary | ICD-10-CM | POA: Diagnosis not present

## 2016-01-13 DIAGNOSIS — M5431 Sciatica, right side: Secondary | ICD-10-CM | POA: Diagnosis not present

## 2016-01-14 ENCOUNTER — Ambulatory Visit (INDEPENDENT_AMBULATORY_CARE_PROVIDER_SITE_OTHER): Payer: Medicare Other

## 2016-01-14 DIAGNOSIS — Z23 Encounter for immunization: Secondary | ICD-10-CM

## 2016-01-15 ENCOUNTER — Encounter: Payer: Self-pay | Admitting: Family Medicine

## 2016-01-15 ENCOUNTER — Ambulatory Visit (INDEPENDENT_AMBULATORY_CARE_PROVIDER_SITE_OTHER): Payer: Medicare Other | Admitting: Family Medicine

## 2016-01-15 VITALS — BP 134/84 | HR 81 | Temp 97.5°F | Wt 173.5 lb

## 2016-01-15 DIAGNOSIS — M545 Low back pain, unspecified: Secondary | ICD-10-CM | POA: Insufficient documentation

## 2016-01-15 DIAGNOSIS — F32A Depression, unspecified: Secondary | ICD-10-CM | POA: Insufficient documentation

## 2016-01-15 DIAGNOSIS — R3915 Urgency of urination: Secondary | ICD-10-CM | POA: Insufficient documentation

## 2016-01-15 DIAGNOSIS — F329 Major depressive disorder, single episode, unspecified: Secondary | ICD-10-CM | POA: Insufficient documentation

## 2016-01-15 DIAGNOSIS — M5441 Lumbago with sciatica, right side: Secondary | ICD-10-CM | POA: Diagnosis not present

## 2016-01-15 DIAGNOSIS — F3289 Other specified depressive episodes: Secondary | ICD-10-CM | POA: Diagnosis not present

## 2016-01-15 LAB — POCT URINALYSIS DIPSTICK
Bilirubin, UA: NEGATIVE
GLUCOSE UA: NEGATIVE
Ketones, UA: NEGATIVE
LEUKOCYTES UA: NEGATIVE
NITRITE UA: NEGATIVE
PH UA: 5.5
Protein, UA: NEGATIVE
RBC UA: NEGATIVE
Spec Grav, UA: >=1.03
UROBILINOGEN UA: NEGATIVE

## 2016-01-15 NOTE — Assessment & Plan Note (Signed)
New problem. Urinalysis negative. Patient describing symptoms of overactive bladder/urge incontinence. Discussed treatment options and patient elected not to proceed with pharmacologic treatment at this time.

## 2016-01-15 NOTE — Progress Notes (Signed)
Pre visit review using our clinic review tool, if applicable. No additional management support is needed unless otherwise documented below in the visit note. 

## 2016-01-15 NOTE — Assessment & Plan Note (Signed)
New problem. Discussed seeing a counselor/therapist. Patient will consider. Discussed pharmacologic treatment, and patient stated that she did not want to take anything on a daily basis. No medication at this time.

## 2016-01-15 NOTE — Assessment & Plan Note (Signed)
New problem. Stable. Doing well with chiropractic treatment.

## 2016-01-15 NOTE — Patient Instructions (Signed)
Let me know if you would like to see a counselor.  Also let me know if you would like to start something regarding your urinary symptoms.  Follow up in 6 months to a year or sooner if needed.

## 2016-01-15 NOTE — Progress Notes (Signed)
Subjective:  Patient ID: Jessica Chavez, female    DOB: 05-14-1950  Age: 65 y.o. MRN: [id number]  CC: Low back pain, ? UTI, Depression  HPI:  65 year old female with history of OA and hyperlipidemia presents with the above complaints.  Low back pain  Patient states she has ongoing low back pain for the past few months.  Low back. Mild to moderate in severity.  Radiates down with legs.  She has been seeing a chiropractor with improvement.  ? UTI  Patient states that for the past month she has had urinary urgency and frequency.  She reports that she frequently feels the urge to void and has had some issues with getting to the bathroom on time.  No associated dysuria.  No flank pain.  No known exacerbating or relieving factors.  She has had some low back pain as outlined above.  No reports of hematuria. No fever or chills.  Depression  Patient lost her husband a little over a year ago.  She has recently relocated to this area  She states that she is having difficulty with depression and grief after loss of her husband.  Patient previously saw grief counselor.  Patient states that she often feels down and upset.  She is not currently seeing a counselor or therapist.  She would like to discuss starting medication that would potentially help with this.  Social Hx   Social History   Social History  . Marital status: Married    Spouse name: N/A  . Number of children: N/A  . Years of education: N/A   Social History Main Topics  . Smoking status: Never Smoker  . Smokeless tobacco: Never Used  . Alcohol use No  . Drug use: Unknown  . Sexual activity: Not Asked   Other Topics Concern  . None   Social History Narrative  . None   Review of Systems  Genitourinary: Positive for frequency and urgency. Negative for dysuria and flank pain.  Musculoskeletal: Positive for back pain.  Psychiatric/Behavioral:       Depressed mood/sadness.   Objective:  BP  134/84 (BP Location: Right Arm, Patient Position: Sitting, Cuff Size: Normal)   Pulse 81   Temp 97.5 F (36.4 C) (Oral)   Wt 173 lb 8 oz (78.7 kg)   SpO2 95%   BMI 28.00 kg/m   BP/Weight 01/15/2016 [id number]  Systolic BP [id number]  Diastolic BP 84 88  Wt. (Lbs) 173.5 165.5  BMI 28 26.73   Physical Exam  Constitutional: She is oriented to person, place, and time. She appears well-developed. No distress.  Cardiovascular: Normal rate and regular rhythm.   Pulmonary/Chest: Effort normal. She has no wheezes. She has no rales.  Abdominal: Soft. She exhibits no distension. There is no tenderness. There is no rebound and no guarding.  Neurological: She is alert and oriented to person, place, and time.  Psychiatric:  Flat affect. Patient became tearful when discussing her mood and loss of her husband last year.  Vitals reviewed.  Lab Results  Component Value Date   WBC 4.9 06/26/2015   HGB 15.1 (H) 06/26/2015   HCT 44.7 06/26/2015   PLT 206.0 06/26/2015   GLUCOSE 92 06/26/2015   CHOL 189 06/26/2015   TRIG 129.0 06/26/2015   HDL 50.40 06/26/2015   LDLCALC 112 (H) 06/26/2015   ALT 17 06/26/2015   AST 15 06/26/2015   NA 141 06/26/2015   K 3.8 06/26/2015   CL 101 06/26/2015   CREATININE  0.75 06/26/2015   BUN 9 06/26/2015   CO2 33 (H) 06/26/2015   Results for orders placed or performed in visit on 01/15/16 (from the past 24 hour(s))  POCT Urinalysis Dipstick     Status: Normal   Collection Time: 01/15/16 10:40 AM  Result Value Ref Range   Color, UA yellow    Clarity, UA clear    Glucose, UA neg    Bilirubin, UA neg    Ketones, UA neg    Spec Grav, UA >=1.030    Blood, UA neg    pH, UA 5.5    Protein, UA neg    Urobilinogen, UA negative    Nitrite, UA neg    Leukocytes, UA Negative Negative   Assessment & Plan:   Problem List Items Addressed This Visit    Depression    New problem. Discussed seeing a counselor/therapist. Patient will consider. Discussed  pharmacologic treatment, and patient stated that she did not want to take anything on a daily basis. No medication at this time.      Low back pain - Primary    New problem. Stable. Doing well with chiropractic treatment.      Urinary urgency    New problem. Urinalysis negative. Patient describing symptoms of overactive bladder/urge incontinence. Discussed treatment options and patient elected not to proceed with pharmacologic treatment at this time.      Relevant Orders   POCT Urinalysis Dipstick (Completed)    Other Visit Diagnoses   None.    Follow-up: Return in about 6 months (around 07/15/2016).  [place]

## 2016-01-30 DIAGNOSIS — M9905 Segmental and somatic dysfunction of pelvic region: Secondary | ICD-10-CM | POA: Diagnosis not present

## 2016-01-30 DIAGNOSIS — M9902 Segmental and somatic dysfunction of thoracic region: Secondary | ICD-10-CM | POA: Diagnosis not present

## 2016-01-30 DIAGNOSIS — M9903 Segmental and somatic dysfunction of lumbar region: Secondary | ICD-10-CM | POA: Diagnosis not present

## 2016-01-30 DIAGNOSIS — R51 Headache: Secondary | ICD-10-CM | POA: Diagnosis not present

## 2016-01-30 DIAGNOSIS — M5431 Sciatica, right side: Secondary | ICD-10-CM | POA: Diagnosis not present

## 2016-01-30 DIAGNOSIS — M791 Myalgia: Secondary | ICD-10-CM | POA: Diagnosis not present

## 2016-01-30 DIAGNOSIS — M62830 Muscle spasm of back: Secondary | ICD-10-CM | POA: Diagnosis not present

## 2016-02-20 DIAGNOSIS — M9903 Segmental and somatic dysfunction of lumbar region: Secondary | ICD-10-CM | POA: Diagnosis not present

## 2016-02-20 DIAGNOSIS — M9902 Segmental and somatic dysfunction of thoracic region: Secondary | ICD-10-CM | POA: Diagnosis not present

## 2016-02-20 DIAGNOSIS — M9905 Segmental and somatic dysfunction of pelvic region: Secondary | ICD-10-CM | POA: Diagnosis not present

## 2016-03-19 DIAGNOSIS — M9903 Segmental and somatic dysfunction of lumbar region: Secondary | ICD-10-CM | POA: Diagnosis not present

## 2016-03-19 DIAGNOSIS — M9905 Segmental and somatic dysfunction of pelvic region: Secondary | ICD-10-CM | POA: Diagnosis not present

## 2016-03-19 DIAGNOSIS — M9902 Segmental and somatic dysfunction of thoracic region: Secondary | ICD-10-CM | POA: Diagnosis not present

## 2016-03-29 ENCOUNTER — Encounter: Payer: Self-pay | Admitting: Family Medicine

## 2016-03-31 ENCOUNTER — Other Ambulatory Visit: Payer: Self-pay | Admitting: Family Medicine

## 2016-04-26 ENCOUNTER — Ambulatory Visit (INDEPENDENT_AMBULATORY_CARE_PROVIDER_SITE_OTHER): Payer: Medicare Other | Admitting: Family Medicine

## 2016-04-26 ENCOUNTER — Encounter: Payer: Self-pay | Admitting: Family Medicine

## 2016-04-26 VITALS — BP 100/76 | HR 101 | Temp 100.8°F | Ht 66.0 in

## 2016-04-26 DIAGNOSIS — R6889 Other general symptoms and signs: Secondary | ICD-10-CM

## 2016-04-26 MED ORDER — OSELTAMIVIR PHOSPHATE 75 MG PO CAPS: 75.0000 mg | ORAL_CAPSULE | Freq: Two times a day (BID) | ORAL | 0 refills | Status: DC

## 2016-04-26 MED ORDER — HYDROCOD POLST-CPM POLST ER 10-8 MG/5ML PO SUER: 5.0000 mL | Freq: Two times a day (BID) | ORAL | 0 refills | Status: DC | PRN

## 2016-04-26 NOTE — Progress Notes (Signed)
HPI:  Flu like symptoms -started: suddenly yesterday -symptoms:nasal congestion, sneezing, cough, fever, body aches, nausea -denies:SOB, wheezing, vomiting, diarrhea, tooth pain, sinus pain, rash -has tried: nothing -sick contacts/travel/risks: several friends with flu like symptoms as well -Hx of: daughter wants a zpack as well as reports in the past the patient go bronchitis and it was treated with a zpack. Pt wants codeine cough syrup as reports it is the only thing that helps when she is sick and she is aware of risks.  ROS: See pertinent positives and negatives per HPI.  Past Medical History:  Diagnosis Date  . Chicken pox   . Colon polyps   . Constipation   . Diverticulitis   . Endometrial cancer ([place])   . Heartburn   . Hiatal hernia   . Hot flashes   . Hyperlipidemia   . Osteoarthritis   . Uterine cancer [facility])     Past Surgical History:  Procedure Laterality Date  . ABDOMINAL HYSTERECTOMY  2000  . APPENDECTOMY  2000  . BREAST BIOPSY    . CHOLECYSTECTOMY  1984  . HEMORROIDECTOMY  1973  . OOPHORECTOMY    . REPLACEMENT TOTAL KNEE Left 2012  . REPLACEMENT TOTAL KNEE Right 2013    Family History  Problem Relation Age of Onset  . Endometriosis Mother   . Breast cancer Mother   . Hyperlipidemia Mother   . Hyperlipidemia Father   . Heart disease Father   . Heart disease Maternal Grandmother   . Heart disease Maternal Grandfather   . Hypertension Father   . Lymphoma Mother   . Osteoarthritis Mother   . COPD Mother   . Cataracts Mother   . Glaucoma Mother     Social History   Social History  . Marital status: Married    Spouse name: N/A  . Number of children: N/A  . Years of education: N/A   Social History Main Topics  . Smoking status: Never Smoker  . Smokeless tobacco: Never Used  . Alcohol use No  . Drug use: Unknown  . Sexual activity: Not Asked   Other Topics Concern  . None   Social History Narrative  . None     Current Outpatient  Prescriptions:  .  AMITIZA 8 MCG capsule, TAKE 1 CAPSULE DAILY WITH  BREAKFAST, Disp: 90 capsule, Rfl: 1 .  Calcium Carbonate-Vit D-Min (CALCIUM 1200 PO), Take by mouth., Disp: , Rfl:  .  Cyanocobalamin (B-12 PO), Take by mouth., Disp: , Rfl:  .  estradiol (VIVELLE-DOT) 0.025 MG/24HR, Place 1 patch onto the skin 2 (two) times a week. Patient is only taking once a week., Disp: , Rfl:  .  LUTEIN PO, Take by mouth., Disp: , Rfl:  .  Multiple Vitamin (MULTIVITAMIN) tablet, Take 1 tablet by mouth daily., Disp: , Rfl:  .  Omega-3 Fatty Acids (OMEGA-3 FISH OIL PO), Take by mouth., Disp: , Rfl:  .  omeprazole (PRILOSEC) 20 MG capsule, Take 1 capsule (20 mg total) by mouth daily., Disp: 90 capsule, Rfl: 3 .  pravastatin (PRAVACHOL) 20 MG tablet, TAKE 1 TABLET DAILY, Disp: 90 tablet, Rfl: 0 .  chlorpheniramine-HYDROcodone (TUSSIONEX PENNKINETIC ER) 10-8 MG/5ML SUER, Take 5 mLs by mouth every 12 (twelve) hours as needed for cough., Disp: 115 mL, Rfl: 0 .  oseltamivir (TAMIFLU) 75 MG capsule, Take 1 capsule (75 mg total) by mouth 2 (two) times daily., Disp: 10 capsule, Rfl: 0  EXAM:  Vitals:   04/26/16 1435  BP: 100/76  Pulse: (!) 101  Temp: (!) 100.8 F (38.2 C)    There is no height or weight on file to calculate BMI.  GENERAL: vitals reviewed and listed above, alert, oriented, appears well hydrated and in no acute distress  HEENT: atraumatic, conjunttiva clear, no obvious abnormalities on inspection of external nose and ears, normal appearance of ear canals and TMs, lots of clear nasal congestion, mild post oropharyngeal erythema with lots of PND, no tonsillar edema or exudate, no sinus TTP  NECK: no obvious masses on inspection  LUNGS: clear to auscultation bilaterally, no wheezes, rales or rhonchi, good air movement  CV: HRRR, no peripheral edema  MS: moves all extremities without noticeable abnormality  PSYCH: pleasant and cooperative, no obvious depression or anxiety  ASSESSMENT AND  PLAN:  Discussed the following assessment and plan:  Influenza-like symptoms  We discussed potential etiologies, with influenza being most likely. Opted to start empiric tamiflu after discussion limitations testing, risks, indications and potential benefit. She wants codeine cough medication. Rx provided after discussion risks and proper use.No signs secondary infection at this time. Did warn of signs of worsening illness, 2ncdary infection, potential complications and serious risks with the flu and advised prompt follow up if any concerns or if not improving over the next few days. Daughter wanted her to take "prophylactic" zpack. Pt opted not to take zpack at this time after discussion risks/benefits/indications. We discussed treatment side effects, likely course, antibiotic misuse, transmission, and signs of developing a serious illness. -of course, we advised to return or notify a doctor immediately if symptoms worsen or persist or new concerns arise.    Patient Instructions  Please start the tamiflu right away.  Can use the cough medication per instructions.   Seek care right away if worsening, trouble breathing, new concerns or you are not improving over the next few days.   Influenza, Adult Influenza ("the flu") is an infection in the lungs, nose, and throat (respiratory tract). It is caused by a virus. The flu causes many common cold symptoms, as well as a high fever and body aches. It can make you feel very sick. The flu spreads easily from person to person (is contagious). Getting a flu shot (influenza vaccination) every year is the best way to prevent the flu. Follow these instructions at home:  Take over-the-counter and prescription medicines only as told by your doctor.  Use a cool mist humidifier to add moisture (humidity) to the air in your home. This can make it easier to breathe.  Rest as needed.  Drink enough fluid to keep your pee (urine) clear or pale  yellow.  Cover your mouth and nose when you cough or sneeze.  Wash your hands with soap and water often, especially after you cough or sneeze. If you cannot use soap and water, use hand sanitizer.  Stay home from work or school as told by your doctor. Unless you are visiting your doctor, try to avoid leaving home until your fever has been gone for 24 hours without the use of medicine.  Keep all follow-up visits as told by your doctor. This is important. How is this prevented?  Getting a yearly (annual) flu shot is the best way to avoid getting the flu. You may get the flu shot in late summer, fall, or winter. Ask your doctor when you should get your flu shot.  Wash your hands often or use hand sanitizer often.  Avoid contact with people who are sick during cold and flu  season.  Eat healthy foods.  Drink plenty of fluids.  Get enough sleep.  Exercise regularly. Contact a doctor if:  You get new symptoms.  You have:  Chest pain.  Watery poop (diarrhea).  A fever.  Your cough gets worse.  You start to have more mucus.  You feel sick to your stomach (nauseous).  You throw up (vomit). Get help right away if:  You start to be short of breath or have trouble breathing.  Your skin or nails turn a bluish color.  You have very bad pain or stiffness in your neck.  You get a sudden headache.  You get sudden pain in your face or ear.  You cannot stop throwing up. This information is not intended to replace advice given to you by your health care provider. Make sure you discuss any questions you have with your health care provider. Document Released: 01/06/2008 Document Revised: 09/04/2015 Document Reviewed: 01/21/2015 Elsevier Interactive Patient Education  2017 [place]., DO

## 2016-04-26 NOTE — Progress Notes (Signed)
Pre visit review using our clinic review tool, if applicable. No additional management support is needed unless otherwise documented below in the visit note. 

## 2016-04-26 NOTE — Patient Instructions (Signed)
Please start the tamiflu right away.  Can use the cough medication per instructions.   Seek care right away if worsening, trouble breathing, new concerns or you are not improving over the next few days.   Influenza, Adult Influenza ("the flu") is an infection in the lungs, nose, and throat (respiratory tract). It is caused by a virus. The flu causes many common cold symptoms, as well as a high fever and body aches. It can make you feel very sick. The flu spreads easily from person to person (is contagious). Getting a flu shot (influenza vaccination) every year is the best way to prevent the flu. Follow these instructions at home:  Take over-the-counter and prescription medicines only as told by your doctor.  Use a cool mist humidifier to add moisture (humidity) to the air in your home. This can make it easier to breathe.  Rest as needed.  Drink enough fluid to keep your pee (urine) clear or pale yellow.  Cover your mouth and nose when you cough or sneeze.  Wash your hands with soap and water often, especially after you cough or sneeze. If you cannot use soap and water, use hand sanitizer.  Stay home from work or school as told by your doctor. Unless you are visiting your doctor, try to avoid leaving home until your fever has been gone for 24 hours without the use of medicine.  Keep all follow-up visits as told by your doctor. This is important. How is this prevented?  Getting a yearly (annual) flu shot is the best way to avoid getting the flu. You may get the flu shot in late summer, fall, or winter. Ask your doctor when you should get your flu shot.  Wash your hands often or use hand sanitizer often.  Avoid contact with people who are sick during cold and flu season.  Eat healthy foods.  Drink plenty of fluids.  Get enough sleep.  Exercise regularly. Contact a doctor if:  You get new symptoms.  You have:  Chest pain.  Watery poop (diarrhea).  A fever.  Your  cough gets worse.  You start to have more mucus.  You feel sick to your stomach (nauseous).  You throw up (vomit). Get help right away if:  You start to be short of breath or have trouble breathing.  Your skin or nails turn a bluish color.  You have very bad pain or stiffness in your neck.  You get a sudden headache.  You get sudden pain in your face or ear.  You cannot stop throwing up. This information is not intended to replace advice given to you by your health care provider. Make sure you discuss any questions you have with your health care provider. Document Released: 01/06/2008 Document Revised: 09/04/2015 Document Reviewed: 01/21/2015 Elsevier Interactive Patient Education  2017 [employer].

## 2016-04-27 ENCOUNTER — Ambulatory Visit: Payer: Medicare Other | Admitting: Family Medicine

## 2016-05-05 DIAGNOSIS — M9902 Segmental and somatic dysfunction of thoracic region: Secondary | ICD-10-CM | POA: Diagnosis not present

## 2016-05-05 DIAGNOSIS — M9905 Segmental and somatic dysfunction of pelvic region: Secondary | ICD-10-CM | POA: Diagnosis not present

## 2016-05-05 DIAGNOSIS — M9903 Segmental and somatic dysfunction of lumbar region: Secondary | ICD-10-CM | POA: Diagnosis not present

## 2016-06-03 DIAGNOSIS — M9902 Segmental and somatic dysfunction of thoracic region: Secondary | ICD-10-CM | POA: Diagnosis not present

## 2016-06-03 DIAGNOSIS — M9905 Segmental and somatic dysfunction of pelvic region: Secondary | ICD-10-CM | POA: Diagnosis not present

## 2016-06-03 DIAGNOSIS — M9903 Segmental and somatic dysfunction of lumbar region: Secondary | ICD-10-CM | POA: Diagnosis not present

## 2016-06-14 ENCOUNTER — Telehealth: Payer: Self-pay | Admitting: Family Medicine

## 2016-06-14 NOTE — Telephone Encounter (Signed)
Scheduled 06/25/16

## 2016-06-14 NOTE — Telephone Encounter (Signed)
Left pt message asking to call Allison back directly at [phone] to schedule AWV. Thanks! °

## 2016-06-25 ENCOUNTER — Ambulatory Visit (INDEPENDENT_AMBULATORY_CARE_PROVIDER_SITE_OTHER): Payer: Medicare Other

## 2016-06-25 VITALS — BP 110/80 | HR 74 | Temp 97.6°F | Resp 12 | Ht 66.0 in | Wt 178.4 lb

## 2016-06-25 DIAGNOSIS — Z0000 Encounter for general adult medical examination without abnormal findings: Secondary | ICD-10-CM | POA: Diagnosis not present

## 2016-06-25 NOTE — Progress Notes (Signed)
Subjective:   Jessica Chavez is a 66 y.o. female who presents for an Initial Medicare Annual Wellness Visit.  Review of Systems    No ROS.  Medicare Wellness Visit.  Cardiac Risk Factors include: advanced age (>59men, >83 women)     Objective:    Today's Vitals   06/25/16 1105  BP: 110/80  Pulse: 74  Resp: 12  Temp: 97.6 F (36.4 C)  TempSrc: Oral  SpO2: 98%  Weight: 178 lb 6.4 oz (80.9 kg)  Height: 5\' 6"  (1.676 m)   Body mass index is 28.79 kg/m.   Current Medications (verified) Outpatient Encounter Prescriptions as of 06/25/2016  Medication Sig  . AMITIZA 8 MCG capsule TAKE 1 CAPSULE DAILY WITH  BREAKFAST  . Calcium Carbonate-Vit D-Min (CALCIUM 1200 PO) Take by mouth.  . Cyanocobalamin (B-12 PO) Take by mouth.  . estradiol (VIVELLE-DOT) 0.025 MG/24HR Place 1 patch onto the skin 2 (two) times a week. Patient is only taking once a week.  . LUTEIN PO Take by mouth.  . Multiple Vitamin (MULTIVITAMIN) tablet Take 1 tablet by mouth daily.  . Omega-3 Fatty Acids (OMEGA-3 FISH OIL PO) Take by mouth.  Marland Kitchen omeprazole (PRILOSEC) 20 MG capsule Take 1 capsule (20 mg total) by mouth daily.  . pravastatin (PRAVACHOL) 20 MG tablet TAKE 1 TABLET DAILY  . [DISCONTINUED] chlorpheniramine-HYDROcodone (TUSSIONEX PENNKINETIC ER) 10-8 MG/5ML SUER Take 5 mLs by mouth every 12 (twelve) hours as needed for cough.  . [DISCONTINUED] oseltamivir (TAMIFLU) 75 MG capsule Take 1 capsule (75 mg total) by mouth 2 (two) times daily.   No facility-administered encounter medications on file as of 06/25/2016.     Allergies (verified) Penicillins   History: Past Medical History:  Diagnosis Date  . Chicken pox   . Colon polyps   . Constipation   . Diverticulitis   . Endometrial cancer ([place])   . Heartburn   . Hiatal hernia   . Hot flashes   . Hyperlipidemia   . Osteoarthritis   . Uterine cancer [facility])    Past Surgical History:  Procedure Laterality Date  . ABDOMINAL HYSTERECTOMY  2000  .  APPENDECTOMY  2000  . BREAST BIOPSY    . CHOLECYSTECTOMY  1984  . HEMORROIDECTOMY  1973  . OOPHORECTOMY    . REPLACEMENT TOTAL KNEE Left 2012  . REPLACEMENT TOTAL KNEE Right 2013   Family History  Problem Relation Age of Onset  . Endometriosis Mother   . Breast cancer Mother   . Hyperlipidemia Mother   . Lymphoma Mother   . Osteoarthritis Mother   . COPD Mother   . Cataracts Mother   . Glaucoma Mother   . Hyperlipidemia Father   . Heart disease Father   . Hypertension Father   . Heart disease Maternal Grandmother   . Heart disease Maternal Grandfather    Social History   Occupational History  . Not on file.   Social History Main Topics  . Smoking status: Never Smoker  . Smokeless tobacco: Never Used  . Alcohol use No  . Drug use: No  . Sexual activity: Not Currently    Tobacco Counseling Counseling given: Not Answered   Activities of Daily Living In your present state of health, do you have any difficulty performing the following activities: 06/25/2016  Hearing? N  Vision? N  Difficulty concentrating or making decisions? N  Walking or climbing stairs? N  Dressing or bathing? N  Doing errands, shopping? N  Preparing Food and eating ?  N  Using the Toilet? N  In the past six months, have you accidently leaked urine? N  Do you have problems with loss of bowel control? N  Managing your Medications? N  Managing your Finances? N  Housekeeping or managing your Housekeeping? N  Some recent data might be hidden    Immunizations and Health Maintenance Immunization History  Administered Date(s) Administered  . Influenza, High Dose Seasonal PF 01/14/2016  . Influenza-Unspecified 02/08/2012, 01/25/2013, 01/27/2015  . Pneumococcal Conjugate-13 06/26/2015  . Pneumococcal Polysaccharide-23 03/18/2014  . Tdap 09/17/2005, 01/14/2016  . Zoster 02/08/2012   Health Maintenance Due  Topic Date Due  . Hepatitis C Screening  October 14, 1950  . MAMMOGRAM  03/12/2016     Patient Care Team: Coral Spikes, DO as PCP - General (Family Medicine)  Indicate any recent Medical Services you may have received from other than [facility] providers in the past year (date may be approximate).     Assessment:   This is a routine wellness examination for Jessica Chavez. The goal of the wellness visit is to assist the patient how to close the gaps in care and create a preventative care plan for the patient.   Taking calcium VIT D as appropriate/Osteoporosis risk reviewed.  Medications reviewed; taking without issues or barriers.  Safety issues reviewed; smoke detectors in the home. No firearms in the home.  Wears seatbelts when driving or riding with others. Patient does wear sunscreen or protective clothing when in direct sunlight. No violence in the home.  Patient is alert, normal appearance, oriented to person/place/and time. Correctly identified the president of the [place], recall of 2/3 words, and performing simple calculations.  Patient displays appropriate judgement and can read correct time from watch face.  No new identified risk were noted.  No failures at ADL's or IADL's.   BMI- discussed the importance of a healthy diet, water intake and exercise. Educational material provided.   Dental- every six months.  Sleep patterns- Sleeps 5-6 hours at night.  Wakes feeling rested.  Hepatitis C screening discussed; educational material provided.  Patient Concerns: None at this time. Follow up with PCP as needed.  Hearing/Vision screen Hearing Screening Comments: Patient is able to hear conversational tones without difficulty.  No issues reported.   Vision Screening Comments: Followed by [facility]  Wears corrective lenses Last OV 2017 Visual acuity not assessed per patient preference since they have regular follow up with the ophthalmologist  Dietary issues and exercise activities discussed: Current Exercise Habits: The patient does not participate in  regular exercise at present  Goals    . Increase physical activity          Stay active,  walk for exercise      Depression Screen PHQ 2/9 Scores 06/25/2016 06/26/2015 06/26/2015  PHQ - 2 Score 0 0 1  PHQ- 9 Score - - 1    Fall Risk Fall Risk  06/25/2016 06/26/2015  Falls in the past year? No No    Cognitive Function: MMSE - Mini Mental State Exam 06/25/2016  Orientation to time 5  Orientation to Place 5  Registration 3  Attention/ Calculation 5  Recall 2  Recall-comments 2 out of 3 words recalled  Language- name 2 objects 2  Language- repeat 1  Language- follow 3 step command 3  Language- read & follow direction 1  Write a sentence 1  Copy design 1  Total score 29        Screening Tests Health Maintenance  Topic  Date Due  . Hepatitis C Screening  [birth year]  . MAMMOGRAM  03/12/2016  . COLONOSCOPY  12/01/2024  . TETANUS/TDAP  01/13/2026  . INFLUENZA VACCINE  Completed  . DEXA SCAN  Addressed  . PNA vac Low Risk Adult  Completed      Plan:    End of life planning; Advance aging; Advanced directives discussed. Copy of current HCPOA/Living Will requested.    Medicare Attestation I have personally reviewed: The patient's medical and social history Their use of alcohol, tobacco or illicit drugs Their current medications and supplements The patient's functional ability including ADLs,fall risks, home safety risks, cognitive, and hearing and visual impairment Diet and physical activities Evidence for depression   The patient's weight, height, BMI, and visual acuity have been recorded in the chart.  I have made referrals and provided education to the patient based on review of the above and I have provided the patient with a written personalized care plan for preventive services.    During the course of the visit, Jessica Chavez was educated and counseled about the following appropriate screening and preventive services:   Vaccines to include Pneumoccal, Influenza,  Hepatitis B, Td, Zostavax, HCV  Colorectal cancer screening-UTD  Bone density screening-UTD  Glaucoma screening-annual eye exam  Mammography-scheduled  PAP-completed 2017, normal  Nutrition counseling  Smoking cessation counseling  Patient Instructions (the written plan) were given to the patient.    Varney Biles, LPN   06/10/3141

## 2016-06-25 NOTE — Progress Notes (Signed)
Care was provided under my supervision. I agree with the management as indicated in the note.  Cataleia Gade DO  

## 2016-06-25 NOTE — Patient Instructions (Addendum)
  Jessica Chavez , Thank you for taking time to come for your Medicare Wellness Visit. I appreciate your ongoing commitment to your health goals. Please review the following plan we discussed and let me know if I can assist you in the future.   Follow up with Dr. Lacinda Axon as needed.    Bring a copy of your [place] and/or Living Will to be scanned into chart.  Have a great day!  These are the goals we discussed: Goals    . Increase physical activity          Stay active,  walk for exercise       This is a list of the screening recommended for you and due dates:  Health Maintenance  Topic Date Due  .  Hepatitis C: One time screening is recommended by Center for Disease Control  (CDC) for  adults born from [birth year] through 1967.   1950-07-16  . Mammogram  03/12/2016  . Colon Cancer Screening  12/01/2024  . Tetanus Vaccine  01/13/2026  . Flu Shot  Completed  . DEXA scan (bone density measurement)  Addressed  . Pneumonia vaccines  Completed

## 2016-06-28 ENCOUNTER — Other Ambulatory Visit: Payer: Self-pay | Admitting: Family Medicine

## 2016-06-28 NOTE — Telephone Encounter (Signed)
Refilled: 12/05/15 Last OV: 01/15/16 Last Labs: 06/26/15 Future OV: 07/08/16 Please advise?

## 2016-06-30 DIAGNOSIS — M9902 Segmental and somatic dysfunction of thoracic region: Secondary | ICD-10-CM | POA: Diagnosis not present

## 2016-06-30 DIAGNOSIS — M9903 Segmental and somatic dysfunction of lumbar region: Secondary | ICD-10-CM | POA: Diagnosis not present

## 2016-06-30 DIAGNOSIS — M9905 Segmental and somatic dysfunction of pelvic region: Secondary | ICD-10-CM | POA: Diagnosis not present

## 2016-07-01 ENCOUNTER — Ambulatory Visit: Payer: Medicare Other | Admitting: Family Medicine

## 2016-07-08 ENCOUNTER — Ambulatory Visit (INDEPENDENT_AMBULATORY_CARE_PROVIDER_SITE_OTHER): Payer: Medicare Other | Admitting: Family Medicine

## 2016-07-08 ENCOUNTER — Encounter: Payer: Self-pay | Admitting: Family Medicine

## 2016-07-08 VITALS — BP 136/84 | HR 81 | Temp 97.7°F | Wt 176.5 lb

## 2016-07-08 DIAGNOSIS — K5900 Constipation, unspecified: Secondary | ICD-10-CM

## 2016-07-08 DIAGNOSIS — K219 Gastro-esophageal reflux disease without esophagitis: Secondary | ICD-10-CM | POA: Diagnosis not present

## 2016-07-08 DIAGNOSIS — Z8542 Personal history of malignant neoplasm of other parts of uterus: Secondary | ICD-10-CM | POA: Diagnosis not present

## 2016-07-08 DIAGNOSIS — E663 Overweight: Secondary | ICD-10-CM

## 2016-07-08 DIAGNOSIS — I6523 Occlusion and stenosis of bilateral carotid arteries: Secondary | ICD-10-CM

## 2016-07-08 DIAGNOSIS — E785 Hyperlipidemia, unspecified: Secondary | ICD-10-CM | POA: Diagnosis not present

## 2016-07-08 DIAGNOSIS — R252 Cramp and spasm: Secondary | ICD-10-CM | POA: Diagnosis not present

## 2016-07-08 LAB — CBC
HCT: 45.3 % (ref 36.0–46.0)
Hemoglobin: 15.5 g/dL — ABNORMAL HIGH (ref 12.0–15.0)
MCHC: 34.2 g/dL (ref 30.0–36.0)
MCV: 89.9 fl (ref 78.0–100.0)
Platelets: 218 10*3/uL (ref 150.0–400.0)
RBC: 5.04 Mil/uL (ref 3.87–5.11)
RDW: 12.9 % (ref 11.5–15.5)
WBC: 4.1 10*3/uL (ref 4.0–10.5)

## 2016-07-08 LAB — LIPID PANEL
CHOL/HDL RATIO: 4
Cholesterol: 187 mg/dL (ref 0–200)
HDL: 53.4 mg/dL (ref >39.00)
LDL Cholesterol: 108 mg/dL — ABNORMAL HIGH (ref 0–99)
NONHDL: 133.5
Triglycerides: 127 mg/dL (ref 0.0–149.0)
VLDL: 25.4 mg/dL (ref 0.0–40.0)

## 2016-07-08 LAB — COMPREHENSIVE METABOLIC PANEL
ALBUMIN: 4.4 g/dL (ref 3.5–5.2)
ALK PHOS: 70 U/L (ref 39–117)
ALT: 21 U/L (ref 0–35)
AST: 18 U/L (ref 0–37)
BILIRUBIN TOTAL: 0.7 mg/dL (ref 0.2–1.2)
BUN: 10 mg/dL (ref 6–23)
CALCIUM: 9.5 mg/dL (ref 8.4–10.5)
CHLORIDE: 104 meq/L (ref 96–112)
CO2: 31 mEq/L (ref 19–32)
CREATININE: 0.76 mg/dL (ref 0.40–1.20)
GFR: 80.49 mL/min (ref >60.00)
Glucose, Bld: 107 mg/dL — ABNORMAL HIGH (ref 70–99)
POTASSIUM: 4 meq/L (ref 3.5–5.1)
Sodium: 141 mEq/L (ref 135–145)
TOTAL PROTEIN: 7.1 g/dL (ref 6.0–8.3)

## 2016-07-08 NOTE — Assessment & Plan Note (Signed)
Stable.  Continue omeprazole.

## 2016-07-08 NOTE — Patient Instructions (Signed)
Continue your medications.  We will call with the appt regarding the [place].  We will call with your lab results.   Take care  Follow up annually or sooner if needed  Dr. Lacinda Axon

## 2016-07-08 NOTE — Progress Notes (Signed)
Pre visit review using our clinic review tool, if applicable. No additional management support is needed unless otherwise documented below in the visit note. 

## 2016-07-08 NOTE — Assessment & Plan Note (Signed)
Stable on Amitiza.

## 2016-07-08 NOTE — Assessment & Plan Note (Signed)
New problem. Normal exam. Advised supportive care/stretching.

## 2016-07-08 NOTE — Assessment & Plan Note (Signed)
Arranging [place].

## 2016-07-08 NOTE — Progress Notes (Signed)
Subjective:  Patient ID: Jessica Chavez, female    DOB: 09/05/1950  Age: 66 y.o. MRN: [id number]  CC: Annual follow up  HPI:  66 year old female with depression, GERD, hyperlipidemia presents for follow-up.  Issues/concerns are as follows:  Patient reports intermittent cramping under her right lower ribs. Mild to moderate. Last briefly and resolves with stretching. She is concerned that this may related to her hiatal hernia. No reports of right upper quadrant pain. She is status post cholecystectomy.  Additionally, patient has recently been seen by her dentist and had an x-ray which revealed carotid calcifications. She was instructed to follow-up with me regarding this. She would like to discuss this today. She is very concerned. She wants to know the next course of action.  GERD is stable on omeprazole.  Constipation is stable on Amitiza. She has not discontinued.  Social Hx   Social History   Social History  . Marital status: Married    Spouse name: N/A  . Number of children: N/A  . Years of education: N/A   Social History Main Topics  . Smoking status: Never Smoker  . Smokeless tobacco: Never Used  . Alcohol use No  . Drug use: No  . Sexual activity: Not Currently   Other Topics Concern  . None   Social History Narrative  . None    Review of Systems  Constitutional: Negative.   Gastrointestinal: Positive for constipation.  Musculoskeletal:       Pain under ribs.   Objective:  BP 136/84 (BP Location: Left Arm, Cuff Size: Normal)   Pulse 81   Temp 97.7 F (36.5 C) (Oral)   Wt 176 lb 8 oz (80.1 kg)   SpO2 95%   BMI 28.49 kg/m   BP/Weight 07/08/2016 06/25/2016 04/17/2692  Systolic BP 854 627 035  Diastolic BP 84 80 76  Wt. (Lbs) 176.5 178.4 -  BMI 28.49 28.79 -    Physical Exam  Constitutional: She is oriented to person, place, and time. She appears well-developed. No distress.  Cardiovascular: Normal rate and regular rhythm.   Pulmonary/Chest:  Effort normal and breath sounds normal.  Abdominal: Soft. She exhibits no distension. There is no tenderness. There is no rebound and no guarding.  Neurological: She is alert and oriented to person, place, and time.  Psychiatric:  Flat affect.  Vitals reviewed.  Lab Results  Component Value Date   WBC 4.9 06/26/2015   HGB 15.1 (H) 06/26/2015   HCT 44.7 06/26/2015   PLT 206.0 06/26/2015   GLUCOSE 92 06/26/2015   CHOL 189 06/26/2015   TRIG 129.0 06/26/2015   HDL 50.40 06/26/2015   LDLCALC 112 (H) 06/26/2015   ALT 17 06/26/2015   AST 15 06/26/2015   NA 141 06/26/2015   K 3.8 06/26/2015   CL 101 06/26/2015   CREATININE 0.75 06/26/2015   BUN 9 06/26/2015   CO2 33 (H) 06/26/2015    Assessment & Plan:   Problem List Items Addressed This Visit    Carotid artery calcification, bilateral    Arranging [place].      Relevant Orders   US Carotid Duplex Bilateral   Constipation    Stable on Amitiza.      GERD (gastroesophageal reflux disease)    Stable. Continue omeprazole.      History of uterine cancer   Relevant Orders   CBC   Hyperlipidemia   Relevant Orders   Lipid panel   Muscle cramp - Primary    New problem.  Normal exam. Advised supportive care/stretching.       Other Visit Diagnoses    Overweight (BMI 25.0-29.9)       Relevant Orders   Comprehensive metabolic panel     Follow-up: Annually  [place]

## 2016-07-12 ENCOUNTER — Telehealth: Payer: Self-pay | Admitting: *Deleted

## 2016-07-12 NOTE — Telephone Encounter (Signed)
Pt was called and given results.

## 2016-07-12 NOTE — Telephone Encounter (Signed)
Patient requested lab/Xray results Pt Contact: [phone]

## 2016-07-30 ENCOUNTER — Encounter: Payer: Self-pay | Admitting: Family Medicine

## 2016-07-30 ENCOUNTER — Other Ambulatory Visit: Payer: Self-pay | Admitting: Family Medicine

## 2016-08-02 ENCOUNTER — Encounter: Payer: Self-pay | Admitting: Family Medicine

## 2016-08-02 ENCOUNTER — Other Ambulatory Visit: Payer: Self-pay | Admitting: Family Medicine

## 2016-08-02 DIAGNOSIS — I6529 Occlusion and stenosis of unspecified carotid artery: Secondary | ICD-10-CM

## 2016-08-02 NOTE — Telephone Encounter (Signed)
Yes the order needs to be changed. The order number is on your desk.

## 2016-08-04 ENCOUNTER — Other Ambulatory Visit: Payer: Self-pay | Admitting: Family Medicine

## 2016-08-04 DIAGNOSIS — I6523 Occlusion and stenosis of bilateral carotid arteries: Secondary | ICD-10-CM

## 2016-08-05 ENCOUNTER — Ambulatory Visit: Payer: Medicare Other

## 2016-08-06 ENCOUNTER — Ambulatory Visit (HOSPITAL_COMMUNITY): Payer: Medicare Other

## 2016-08-09 DIAGNOSIS — H2513 Age-related nuclear cataract, bilateral: Secondary | ICD-10-CM | POA: Diagnosis not present

## 2016-08-10 ENCOUNTER — Ambulatory Visit: Payer: Medicare Other

## 2016-08-10 ENCOUNTER — Ambulatory Visit (HOSPITAL_COMMUNITY)
Admission: RE | Admit: 2016-08-10 | Discharge: 2016-08-10 | Disposition: A | Discharge status: Home / Self Care | Payer: Medicare Other | Source: Ambulatory Visit | Attending: Family Medicine | Admitting: Family Medicine

## 2016-08-10 DIAGNOSIS — I6523 Occlusion and stenosis of bilateral carotid arteries: Secondary | ICD-10-CM

## 2016-08-10 LAB — VAS US CAROTID
LCCAPDIAS: 25 cm/s
LCCAPSYS: 132 cm/s
LEFT ECA DIAS: -15 cm/s
LEFT VERTEBRAL DIAS: -19 cm/s
LICADDIAS: 18 cm/s
LICAPSYS: -72 cm/s
Left CCA dist dias: 27 cm/s
Left CCA dist sys: 82 cm/s
Left ICA dist sys: 54 cm/s
Left ICA prox dias: -32 cm/s
RCCADSYS: -106 cm/s
RIGHT ECA DIAS: -11 cm/s
RIGHT VERTEBRAL DIAS: -15 cm/s
Right CCA prox dias: -11 cm/s
Right CCA prox sys: -91 cm/s

## 2016-08-10 NOTE — Progress Notes (Signed)
**  Preliminary report by tech**  Carotid artery duplex complete. Findings are consistent with a 1-39 percent stenosis involving the right internal carotid artery and the left internal carotid artery. The vertebral arteries demonstrate antegrade flow.  08/10/16 1:36 PM Jessica Chavez RVT

## 2016-09-25 ENCOUNTER — Other Ambulatory Visit: Payer: Self-pay | Admitting: Family Medicine

## 2016-09-29 DIAGNOSIS — Z1231 Encounter for screening mammogram for malignant neoplasm of breast: Secondary | ICD-10-CM | POA: Diagnosis not present

## 2016-09-29 DIAGNOSIS — R1031 Right lower quadrant pain: Secondary | ICD-10-CM | POA: Diagnosis not present

## 2016-09-29 DIAGNOSIS — Z01419 Encounter for gynecological examination (general) (routine) without abnormal findings: Secondary | ICD-10-CM | POA: Diagnosis not present

## 2016-09-29 DIAGNOSIS — Z6829 Body mass index (BMI) 29.0-29.9, adult: Secondary | ICD-10-CM | POA: Diagnosis not present

## 2016-10-04 DIAGNOSIS — H18459 Nodular corneal degeneration, unspecified eye: Secondary | ICD-10-CM | POA: Diagnosis not present

## 2016-10-04 DIAGNOSIS — H18451 Nodular corneal degeneration, right eye: Secondary | ICD-10-CM | POA: Diagnosis not present

## 2016-11-05 ENCOUNTER — Encounter: Payer: Self-pay | Admitting: Internal Medicine

## 2016-11-09 ENCOUNTER — Other Ambulatory Visit: Payer: Self-pay | Admitting: Obstetrics and Gynecology

## 2016-11-09 DIAGNOSIS — Z1509 Genetic susceptibility to other malignant neoplasm: Secondary | ICD-10-CM

## 2016-11-09 DIAGNOSIS — Z1501 Genetic susceptibility to malignant neoplasm of breast: Secondary | ICD-10-CM

## 2016-11-09 DIAGNOSIS — Z803 Family history of malignant neoplasm of breast: Secondary | ICD-10-CM

## 2016-11-15 DIAGNOSIS — H2513 Age-related nuclear cataract, bilateral: Secondary | ICD-10-CM | POA: Diagnosis not present

## 2016-11-26 ENCOUNTER — Encounter: Payer: Self-pay | Admitting: Family Medicine

## 2016-11-26 ENCOUNTER — Ambulatory Visit (INDEPENDENT_AMBULATORY_CARE_PROVIDER_SITE_OTHER): Payer: Medicare Other | Admitting: Family Medicine

## 2016-11-26 VITALS — BP 120/78 | HR 88 | Temp 97.8°F | Wt 178.2 lb

## 2016-11-26 DIAGNOSIS — J0100 Acute maxillary sinusitis, unspecified: Secondary | ICD-10-CM | POA: Insufficient documentation

## 2016-11-26 DIAGNOSIS — R200 Anesthesia of skin: Secondary | ICD-10-CM

## 2016-11-26 DIAGNOSIS — I6529 Occlusion and stenosis of unspecified carotid artery: Secondary | ICD-10-CM

## 2016-11-26 MED ORDER — FLUTICASONE PROPIONATE 50 MCG/ACT NA SUSP: 2.0000 | Freq: Every day | NASAL | 1 refills | Status: DC

## 2016-11-26 MED ORDER — DOXYCYCLINE HYCLATE 100 MG PO TABS: 100.0000 mg | ORAL_TABLET | Freq: Two times a day (BID) | ORAL | 0 refills | Status: DC

## 2016-11-26 NOTE — Patient Instructions (Addendum)
Take doxycycline 10 day course as well as flonase nasal steroid for possible sinus infection.  See our referral coordinator to schedule ENT appointment.  Let us know if not improving with treatment.

## 2016-11-26 NOTE — Progress Notes (Signed)
BP 120/78   Pulse 88   Temp 97.8 F (36.6 C) (Oral)   Wt 178 lb 4 oz (80.9 kg)   SpO2 97%   BMI 28.77 kg/m    CC: check sinuses Subjective:    Patient ID: Jessica Chavez, female    DOB: 1951/03/16, 66 y.o.   MRN: [id number]  HPI: Jessica Chavez is a 66 y.o. female presenting on 11/26/2016 for Sinus Problem (f/u per dentist)   Patient of Dr Jonathon Jordan, new to me.   She saw dentist at beginning of July - since then has noticed small red raised rough spot below left eye, as well as felt left maxillary skin sensitivity to touch, intermittent swelling, and numbness. No fevers, no vesicular rash.  Over last 2 weeks also developing tickle in back of throat and cough productive of yellow sputum. Some hoarseness and congestion and rhinorrhea. She has numbness to left facial area that has persisted for the past week.  Denies fevers/chills, ear or tooth pain, ST, headaches.  No new confusion, one sided weakness, other paresthesias, uncontrolled hypertension or slurred speech.   Returned to see dentist yesterday. No obvious dental issues (couldn't rule out communication with impacted wisdom tooth on left) - letter reviewed. Advised to see ENT. She has also recently seen Dr Raynald Blend ophtho and had laser surgery of some corneal nodules to right eye. 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 Denies h/o bell's palsy.   Denies new medicines. No new foods. No new lotions, detergents, soaps or shampoos.  She is on once weekly HRT patch  Relevant past medical, surgical, family and social history reviewed and updated as indicated. Interim medical history since our last visit reviewed. Allergies and medications reviewed and updated. Outpatient Medications Prior to Visit  Medication Sig Dispense Refill  . AMITIZA 8 MCG capsule TAKE 1 CAPSULE DAILY WITH  BREAKFAST 90 capsule 1  . Calcium Carbonate-Vit D-Min (CALCIUM 1200 PO) Take by mouth.    . Cyanocobalamin (B-12 PO) Take by mouth.    . estradiol  (VIVELLE-DOT) 0.025 MG/24HR Place 0.5 patches onto the skin 2 (two) times a week. Patient is only taking once a week.     . LUTEIN PO Take by mouth.    . Multiple Vitamin (MULTIVITAMIN) tablet Take 1 tablet by mouth daily.    . Omega-3 Fatty Acids (OMEGA-3 FISH OIL PO) Take by mouth.    . pravastatin (PRAVACHOL) 20 MG tablet TAKE 1 TABLET DAILY 90 tablet 0  . pravastatin (PRAVACHOL) 20 MG tablet Take 1 tablet (20 mg total) by mouth daily. 90 tablet 0   No facility-administered medications prior to visit.      Per HPI unless specifically indicated in ROS section below Review of Systems     Objective:    BP 120/78   Pulse 88   Temp 97.8 F (36.6 C) (Oral)   Wt 178 lb 4 oz (80.9 kg)   SpO2 97%   BMI 28.77 kg/m   Wt Readings from Last 3 Encounters:  11/26/16 178 lb 4 oz (80.9 kg)  07/08/16 176 lb 8 oz (80.1 kg)  06/25/16 178 lb 6.4 oz (80.9 kg)    Physical Exam  Constitutional: She is oriented to person, place, and time. She appears well-developed and well-nourished. No distress.  HENT:  Head: Normocephalic and atraumatic.  Right Ear: Hearing, tympanic membrane, external ear and ear canal normal.  Left Ear: Hearing, tympanic membrane, external ear and ear canal normal.  Nose: No mucosal  edema or rhinorrhea. Right sinus exhibits no maxillary sinus tenderness and no frontal sinus tenderness. Left sinus exhibits no maxillary sinus tenderness and no frontal sinus tenderness.  Mouth/Throat: Uvula is midline and mucous membranes are normal. No oropharyngeal exudate, posterior oropharyngeal edema, posterior oropharyngeal erythema or tonsillar abscesses.  Proximal nasal mucosal erythema and congestion No ethmoid sinus pressure  Eyes: Pupils are equal, round, and reactive to light. Conjunctivae and EOM are normal. No scleral icterus.  Neck: Normal range of motion. Neck supple.  Cardiovascular: Normal rate, regular rhythm, normal heart sounds and intact distal pulses.   No murmur  heard. Pulmonary/Chest: Effort normal and breath sounds normal. No respiratory distress. She has no wheezes. She has no rales.  Lymphadenopathy:    She has no cervical adenopathy.  Neurological: She is alert and oriented to person, place, and time. No cranial nerve deficit.  CN 2-12 intact EOMI Chronic R eye ptosis  Skin: Skin is warm and dry. No rash noted.  Slight erythematous raise patch of skin at left infra-orbital skin but not rough  Nursing note and vitals reviewed.     Assessment & Plan:   Problem List Items Addressed This Visit    Acute non-recurrent maxillary sinusitis - Primary    Facial/sinus congestion ongoing over last 2 weeks - will cover for possible bacterial sinusitis with doxycycline course. Also recommended continue nasal saline as well as addition of OTC intranasal steroid. Will see if this resolves other nonspecific symptoms. Pt agrees with plan.       Relevant Medications   doxycycline (VIBRA-TABS) 100 MG tablet   fluticasone (FLONASE) 50 MCG/ACT nasal spray   Other Relevant Orders   Ambulatory referral to ENT   Left facial numbness    New L facial numbness over the past week that seems to be isolated to maxillary branch of L trigeminal nerve. Not consistent with bells palsy, trigeminal neuralgia.  Dentist recommended ENT eval. Will see if sxs resolve with sinusitis treatment, will also place referral to ENT for further evaluation.  Minimal red spot left face - doubt of clinical significance.  Pt agrees with plan.       Relevant Orders   Ambulatory referral to ENT       Follow up plan: Return if symptoms worsen or fail to improve.  Ria Bush, MD

## 2016-11-27 NOTE — Assessment & Plan Note (Signed)
New L facial numbness over the past week that seems to be isolated to maxillary branch of L trigeminal nerve. Not consistent with bells palsy, trigeminal neuralgia.  Dentist recommended ENT eval. Will see if sxs resolve with sinusitis treatment, will also place referral to ENT for further evaluation.  Minimal red spot left face - doubt of clinical significance.  Pt agrees with plan.

## 2016-11-27 NOTE — Assessment & Plan Note (Addendum)
Facial/sinus congestion ongoing over last 2 weeks - will cover for possible bacterial sinusitis with doxycycline course. Also recommended continue nasal saline as well as addition of OTC intranasal steroid. Will see if this resolves other nonspecific symptoms. Pt agrees with plan.

## 2016-11-30 DIAGNOSIS — R200 Anesthesia of skin: Secondary | ICD-10-CM | POA: Diagnosis not present

## 2016-11-30 DIAGNOSIS — D385 Neoplasm of uncertain behavior of other respiratory organs: Secondary | ICD-10-CM | POA: Diagnosis not present

## 2016-11-30 DIAGNOSIS — R202 Paresthesia of skin: Secondary | ICD-10-CM | POA: Diagnosis not present

## 2016-12-01 ENCOUNTER — Other Ambulatory Visit: Payer: Self-pay | Admitting: Obstetrics and Gynecology

## 2016-12-02 ENCOUNTER — Other Ambulatory Visit: Payer: Self-pay | Admitting: Otolaryngology

## 2016-12-02 DIAGNOSIS — R220 Localized swelling, mass and lump, head: Principal | ICD-10-CM

## 2016-12-02 DIAGNOSIS — J3489 Other specified disorders of nose and nasal sinuses: Secondary | ICD-10-CM

## 2016-12-06 ENCOUNTER — Ambulatory Visit
Admission: RE | Admit: 2016-12-06 | Discharge: 2016-12-06 | Disposition: A | Discharge status: Home / Self Care | Payer: Medicare Other | Source: Ambulatory Visit | Attending: Otolaryngology | Admitting: Otolaryngology

## 2016-12-06 DIAGNOSIS — R220 Localized swelling, mass and lump, head: Principal | ICD-10-CM

## 2016-12-06 DIAGNOSIS — R200 Anesthesia of skin: Secondary | ICD-10-CM | POA: Diagnosis not present

## 2016-12-06 DIAGNOSIS — J3489 Other specified disorders of nose and nasal sinuses: Secondary | ICD-10-CM

## 2016-12-06 MED ORDER — GADOBENATE DIMEGLUMINE 529 MG/ML IV SOLN
15.0000 mL | Freq: Once | INTRAVENOUS | Status: AC | PRN
  Administered 2016-12-06: 15 mL via INTRAVENOUS

## 2016-12-09 NOTE — H&P (Signed)
HPI:   Jessica Chavez is a 66 y.o. female who presents as a consult Patient.   Referring Provider: Ria Bush, MD  Chief complaint: Left facial numbness.  HPI: One-week history of numbness around the left mid face, V2 distribution, distal part of V2. It includes her upper teeth. She does not really have any pain but she does have a pressure sensation. She has a chronic ptosis of the right eye for many years which is unaffected by this. She does not have any specific nasal or sinus symptoms otherwise.  P 1 week history of MH/Meds/All/SocHx/FamHx/ROS:   Past Medical History:  Diagnosis Date  . Acid reflux  . Arthritis  . High cholesterol   Past Surgical History:  Procedure Laterality Date  . APPENDECTOMY  . BREAST BIOPSY  . HYSTERECTOMY  . MOUTH SURGERY  . REPLACEMENT TOTAL KNEE  . TUBAL LIGATION   No family history of bleeding disorders, wound healing problems or difficulty with anesthesia.   Social History   Social History  . Marital status: N/A  Spouse name: N/A  . Number of children: N/A  . Years of education: N/A   Occupational History  . Not on file.   Social History Main Topics  . Smoking status: Never Smoker  . Smokeless tobacco: Never Used  . Alcohol use Not on file  . Drug use: Unknown  . Sexual activity: Not on file   Other Topics Concern  . Not on file   Social History Narrative  . No narrative on file   Current Outpatient Prescriptions:  . AMITIZA 8 mcg capsule, , Disp: , Rfl:  . estradiol (VIVELLE-DOT) 0.025 mg/24 hr, Place 1 patch onto the skin twice a week., Disp: , Rfl:  . omeprazole/sodium bicarbonate (OMEPPI ORAL), Take by mouth., Disp: , Rfl:  . pravastatin (PRAVACHOL) 20 MG tablet, , Disp: , Rfl:   A complete ROS was performed with pertinent positives/negatives noted in the HPI. The remainder of the ROS are negative.   Physical Exam:   BP 135/82 (Site: Left arm, Position: Sitting)  Ht 1.676 m (5\' 6" )  Wt 80.7 kg (178 lb)   BMI 28.73 kg/m   General: Healthy and alert, in no distress, breathing easily. Normal affect. In a pleasant mood. Head: Normocephalic, atraumatic. No masses, or scars. Eyes: Pupils are equal, and reactive to light. Vision is grossly intact. No spontaneous or gaze nystagmus. Ears: Ear canals are clear. Tympanic membranes are intact, with normal landmarks and the middle ears are clear and healthy. Hearing: Grossly normal. Nose: Nasal cavities are clear with healthy mucosa, no polyps or exudate.Airways are patent. Face: No masses or scars, facial nerve function is symmetric. She has hypesthesia of the distal V2 distribution on the left up to the. Otherwise normal function. Oral Cavity: No mucosal abnormalities are noted. Tongue with normal mobility. Dentition appears healthy. Oropharynx: Tonsils are symmetric. There are no mucosal masses identified. Tongue base appears normal and healthy. Larynx/Hypopharynx: deferred Chest: Deferred Neck: No palpable masses, no cervical adenopathy, no thyroid nodules or enlargement. Neuro: Cranial nerves II-XII will normal function. Balance: Normal gate. Other findings: none.  Independent Review of Additional Tests or Records:  Sinus CT reveals soft tissue density around the area of the superolateral maxillary antrum on the left adjacent to where the nerve is. The nerve canal itself appears to be enlarged. There may be some bony destruction.  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 I will review  this with radiology later today. Next step may include MRI imaging to further evaluate the course of the nerve in the soft tissues adjacent. Next step may also include maxillary antrostomy with biopsy.

## 2016-12-11 ENCOUNTER — Ambulatory Visit
Admission: RE | Admit: 2016-12-11 | Discharge: 2016-12-11 | Disposition: A | Discharge status: Home / Self Care | Payer: Medicare Other | Source: Ambulatory Visit | Attending: Obstetrics and Gynecology | Admitting: Obstetrics and Gynecology

## 2016-12-11 DIAGNOSIS — Z1501 Genetic susceptibility to malignant neoplasm of breast: Secondary | ICD-10-CM

## 2016-12-11 DIAGNOSIS — Z803 Family history of malignant neoplasm of breast: Secondary | ICD-10-CM

## 2016-12-11 DIAGNOSIS — Z1509 Genetic susceptibility to other malignant neoplasm: Secondary | ICD-10-CM

## 2016-12-14 ENCOUNTER — Other Ambulatory Visit: Payer: Self-pay | Admitting: Family Medicine

## 2016-12-15 ENCOUNTER — Encounter (HOSPITAL_BASED_OUTPATIENT_CLINIC_OR_DEPARTMENT_OTHER): Payer: Self-pay | Admitting: *Deleted

## 2016-12-22 ENCOUNTER — Encounter (HOSPITAL_BASED_OUTPATIENT_CLINIC_OR_DEPARTMENT_OTHER): Payer: Self-pay | Admitting: Anesthesiology

## 2016-12-22 ENCOUNTER — Ambulatory Visit (HOSPITAL_BASED_OUTPATIENT_CLINIC_OR_DEPARTMENT_OTHER): Payer: Medicare Other | Admitting: Anesthesiology

## 2016-12-22 ENCOUNTER — Ambulatory Visit (HOSPITAL_BASED_OUTPATIENT_CLINIC_OR_DEPARTMENT_OTHER)
Admission: RE | Admit: 2016-12-22 | Discharge: 2016-12-22 | Disposition: A | Discharge status: Home / Self Care | Payer: Medicare Other | Source: Ambulatory Visit | Attending: Otolaryngology | Admitting: Otolaryngology

## 2016-12-22 ENCOUNTER — Encounter (HOSPITAL_BASED_OUTPATIENT_CLINIC_OR_DEPARTMENT_OTHER)
Admission: RE | Disposition: A | Discharge status: Home / Self Care | Payer: Self-pay | Source: Ambulatory Visit | Attending: Otolaryngology

## 2016-12-22 DIAGNOSIS — E7800 Pure hypercholesterolemia, unspecified: Secondary | ICD-10-CM | POA: Diagnosis not present

## 2016-12-22 DIAGNOSIS — J320 Chronic maxillary sinusitis: Secondary | ICD-10-CM | POA: Insufficient documentation

## 2016-12-22 DIAGNOSIS — E785 Hyperlipidemia, unspecified: Secondary | ICD-10-CM | POA: Diagnosis not present

## 2016-12-22 DIAGNOSIS — Z8542 Personal history of malignant neoplasm of other parts of uterus: Secondary | ICD-10-CM | POA: Diagnosis not present

## 2016-12-22 DIAGNOSIS — K219 Gastro-esophageal reflux disease without esophagitis: Secondary | ICD-10-CM | POA: Diagnosis not present

## 2016-12-22 DIAGNOSIS — Z79899 Other long term (current) drug therapy: Secondary | ICD-10-CM | POA: Insufficient documentation

## 2016-12-22 DIAGNOSIS — Z96659 Presence of unspecified artificial knee joint: Secondary | ICD-10-CM | POA: Insufficient documentation

## 2016-12-22 DIAGNOSIS — Z79890 Hormone replacement therapy (postmenopausal): Secondary | ICD-10-CM | POA: Diagnosis not present

## 2016-12-22 DIAGNOSIS — R220 Localized swelling, mass and lump, head: Secondary | ICD-10-CM | POA: Diagnosis not present

## 2016-12-22 DIAGNOSIS — R200 Anesthesia of skin: Secondary | ICD-10-CM | POA: Diagnosis not present

## 2016-12-22 DIAGNOSIS — J329 Chronic sinusitis, unspecified: Secondary | ICD-10-CM | POA: Diagnosis not present

## 2016-12-22 HISTORY — DX: Other specified postprocedural states: Z98.890

## 2016-12-22 HISTORY — DX: Nausea with vomiting, unspecified: R11.2

## 2016-12-22 HISTORY — DX: Gastro-esophageal reflux disease without esophagitis: K21.9

## 2016-12-22 HISTORY — PX: MAXILLARY ANTROSTOMY: SHX2003

## 2016-12-22 HISTORY — PX: MASS EXCISION: SHX2000

## 2016-12-22 SURGERY — MAXILLARY ANTROSTOMY
Anesthesia: General

## 2016-12-22 MED ORDER — PROMETHAZINE HCL 25 MG/ML IJ SOLN
INTRAMUSCULAR | Status: AC
  Filled 2016-12-22: qty 1

## 2016-12-22 MED ORDER — SODIUM CHLORIDE 0.9 % IJ SOLN
INTRAMUSCULAR | Status: AC
  Filled 2016-12-22: qty 10

## 2016-12-22 MED ORDER — DEXAMETHASONE SODIUM PHOSPHATE 4 MG/ML IJ SOLN
INTRAMUSCULAR | Status: DC | PRN
  Administered 2016-12-22: 10 mg via INTRAVENOUS

## 2016-12-22 MED ORDER — ONDANSETRON HCL 4 MG/2ML IJ SOLN
INTRAMUSCULAR | Status: AC
  Filled 2016-12-22: qty 2

## 2016-12-22 MED ORDER — LACTATED RINGERS IV SOLN
INTRAVENOUS | Status: DC
  Administered 2016-12-22: 09:00:00 via INTRAVENOUS

## 2016-12-22 MED ORDER — PROPOFOL 10 MG/ML IV BOLUS
INTRAVENOUS | Status: DC | PRN
  Administered 2016-12-22 (×2): 50 mg via INTRAVENOUS
  Administered 2016-12-22: 150 mg via INTRAVENOUS

## 2016-12-22 MED ORDER — OXYMETAZOLINE HCL 0.05 % NA SOLN
2.0000 | NASAL | Status: DC
  Administered 2016-12-22: 2 via NASAL

## 2016-12-22 MED ORDER — FENTANYL CITRATE (PF) 100 MCG/2ML IJ SOLN
INTRAMUSCULAR | Status: AC
  Filled 2016-12-22: qty 2

## 2016-12-22 MED ORDER — LIDOCAINE-EPINEPHRINE 1 %-1:100000 IJ SOLN
INTRAMUSCULAR | Status: DC | PRN
  Administered 2016-12-22: 4 mL

## 2016-12-22 MED ORDER — ROCURONIUM BROMIDE 100 MG/10ML IV SOLN
INTRAVENOUS | Status: DC | PRN
  Administered 2016-12-22: 45 mg via INTRAVENOUS

## 2016-12-22 MED ORDER — MIDAZOLAM HCL 2 MG/2ML IJ SOLN
1.0000 mg | INTRAMUSCULAR | Status: DC | PRN
  Administered 2016-12-22: 2 mg via INTRAVENOUS

## 2016-12-22 MED ORDER — SCOPOLAMINE 1 MG/3DAYS TD PT72
1.0000 | MEDICATED_PATCH | Freq: Once | TRANSDERMAL | Status: AC | PRN
  Administered 2016-12-22: 1.5 mg via TRANSDERMAL
  Administered 2016-12-22: 1 via TRANSDERMAL

## 2016-12-22 MED ORDER — MEPERIDINE HCL 25 MG/ML IJ SOLN: 6.2500 mg | INTRAMUSCULAR | Status: DC | PRN

## 2016-12-22 MED ORDER — FENTANYL CITRATE (PF) 100 MCG/2ML IJ SOLN: 25.0000 ug | INTRAMUSCULAR | Status: DC | PRN

## 2016-12-22 MED ORDER — ONDANSETRON HCL 4 MG/2ML IJ SOLN
INTRAMUSCULAR | Status: DC | PRN
  Administered 2016-12-22: 4 mg via INTRAVENOUS

## 2016-12-22 MED ORDER — FENTANYL CITRATE (PF) 100 MCG/2ML IJ SOLN
50.0000 ug | INTRAMUSCULAR | Status: AC | PRN
  Administered 2016-12-22 (×4): 50 ug via INTRAVENOUS

## 2016-12-22 MED ORDER — OXYMETAZOLINE HCL 0.05 % NA SOLN
NASAL | Status: DC | PRN
  Administered 2016-12-22: 1

## 2016-12-22 MED ORDER — DEXAMETHASONE SODIUM PHOSPHATE 10 MG/ML IJ SOLN
INTRAMUSCULAR | Status: AC
  Filled 2016-12-22: qty 1

## 2016-12-22 MED ORDER — SCOPOLAMINE 1 MG/3DAYS TD PT72
MEDICATED_PATCH | TRANSDERMAL | Status: AC
  Filled 2016-12-22: qty 1

## 2016-12-22 MED ORDER — PROPOFOL 10 MG/ML IV BOLUS
INTRAVENOUS | Status: AC
  Filled 2016-12-22: qty 20

## 2016-12-22 MED ORDER — MIDAZOLAM HCL 2 MG/2ML IJ SOLN: 0.5000 mg | Freq: Once | INTRAMUSCULAR | Status: DC | PRN

## 2016-12-22 MED ORDER — LIDOCAINE 2% (20 MG/ML) 5 ML SYRINGE
INTRAMUSCULAR | Status: DC | PRN
  Administered 2016-12-22: 20 mg via INTRAVENOUS

## 2016-12-22 MED ORDER — OXYMETAZOLINE HCL 0.05 % NA SOLN
NASAL | Status: AC
  Filled 2016-12-22: qty 15

## 2016-12-22 MED ORDER — SUGAMMADEX SODIUM 200 MG/2ML IV SOLN
INTRAVENOUS | Status: DC | PRN
  Administered 2016-12-22: 500 mg via INTRAVENOUS

## 2016-12-22 MED ORDER — MIDAZOLAM HCL 2 MG/2ML IJ SOLN
INTRAMUSCULAR | Status: AC
  Filled 2016-12-22: qty 2

## 2016-12-22 MED ORDER — PROMETHAZINE HCL 25 MG/ML IJ SOLN
6.2500 mg | INTRAMUSCULAR | Status: DC | PRN
  Administered 2016-12-22: 6.25 mg via INTRAVENOUS

## 2016-12-22 SURGICAL SUPPLY — 54 items
ATTRACTOMAT 16X20 MAGNETIC DRP (DRAPES) ×1 IMPLANT
BLADE RAD40 ROTATE 4M 4 5PK (BLADE) IMPLANT
BLADE RAD60 ROTATE M4 4 5PK (BLADE) IMPLANT
BLADE SURG 15 STRL LF DISP TIS (BLADE) ×1 IMPLANT
BLADE SURG 15 STRL SS (BLADE)
BLADE TRICUT ROTATE M4 4 5PK (BLADE) IMPLANT
BUR HS RAD FRONTAL 3 (BURR) IMPLANT
CANISTER SUC SOCK COL 7IN (MISCELLANEOUS) IMPLANT
CANISTER SUCT 1200ML W/VALVE (MISCELLANEOUS) ×4 IMPLANT
CORD BIPOLAR FORCEPS 12FT (ELECTRODE) IMPLANT
DECANTER SPIKE VIAL GLASS SM (MISCELLANEOUS) IMPLANT
DRAPE U-SHAPE 76X120 STRL (DRAPES) ×1 IMPLANT
DRESSING ADAPTIC 1/2  N-ADH (PACKING) IMPLANT
DRESSING NASAL KENNEDY 3.5X.9 (MISCELLANEOUS) IMPLANT
DRSG NASAL KENNEDY 3.5X.9 (MISCELLANEOUS)
DRSG NASAL KENNEDY LMNT 8CM (GAUZE/BANDAGES/DRESSINGS) IMPLANT
DRSG NASOPORE 8CM (GAUZE/BANDAGES/DRESSINGS) IMPLANT
ELECT REM PT RETURN 9FT ADLT (ELECTROSURGICAL) ×2
ELECTRODE REM PT RTRN 9FT ADLT (ELECTROSURGICAL) ×1 IMPLANT
FORCEPS BIPOLAR SPETZLER 8 1.0 (NEUROSURGERY SUPPLIES) IMPLANT
GAUZE SPONGE 4X4 12PLY STRL LF (GAUZE/BANDAGES/DRESSINGS) IMPLANT
GAUZE SPONGE 4X4 16PLY XRAY LF (GAUZE/BANDAGES/DRESSINGS) IMPLANT
GAUZE VASELINE FOILPK 1/2 X 72 (GAUZE/BANDAGES/DRESSINGS) IMPLANT
GLOVE BIOGEL PI IND STRL 7.0 (GLOVE) IMPLANT
GLOVE BIOGEL PI INDICATOR 7.0 (GLOVE) ×1
GLOVE ECLIPSE 6.5 STRL STRAW (GLOVE) ×1 IMPLANT
GLOVE ECLIPSE 7.5 STRL STRAW (GLOVE) ×2 IMPLANT
GOWN STRL REUS W/ TWL LRG LVL3 (GOWN DISPOSABLE) ×2 IMPLANT
GOWN STRL REUS W/TWL LRG LVL3 (GOWN DISPOSABLE) ×4
HEMOSTAT SURGICEL .5X2 ABSORB (HEMOSTASIS) IMPLANT
IV NS 500ML (IV SOLUTION)
IV NS 500ML BAXH (IV SOLUTION) IMPLANT
NDL PRECISIONGLIDE 27X1.5 (NEEDLE) ×1 IMPLANT
NDL SPNL 25GX3.5 QUINCKE BL (NEEDLE) IMPLANT
NEEDLE PRECISIONGLIDE 27X1.5 (NEEDLE) ×2 IMPLANT
NEEDLE SPNL 25GX3.5 QUINCKE BL (NEEDLE) IMPLANT
NS IRRIG 1000ML POUR BTL (IV SOLUTION) ×1 IMPLANT
PACK BASIN DAY SURGERY FS (CUSTOM PROCEDURE TRAY) ×2 IMPLANT
PACK ENT DAY SURGERY (CUSTOM PROCEDURE TRAY) ×2 IMPLANT
PATTIES SURGICAL .5 X3 (DISPOSABLE) ×2 IMPLANT
PENCIL FOOT CONTROL (ELECTRODE) ×1 IMPLANT
SLEEVE SCD COMPRESS KNEE MED (MISCELLANEOUS) ×2 IMPLANT
SOLUTION BUTLER CLEAR DIP (MISCELLANEOUS) ×2 IMPLANT
SPONGE GAUZE 2X2 8PLY STRL LF (GAUZE/BANDAGES/DRESSINGS) ×2 IMPLANT
SPONGE SURGIFOAM ABS GEL 12-7 (HEMOSTASIS) IMPLANT
SUT CHROMIC 3 0 PS 2 (SUTURE) IMPLANT
SUT CHROMIC 4 0 P 3 18 (SUTURE) IMPLANT
SUT PLAIN 5 0 P 3 18 (SUTURE) IMPLANT
SUT VICRYL 4-0 PS2 18IN ABS (SUTURE) IMPLANT
TOWEL OR 17X24 6PK STRL BLUE (TOWEL DISPOSABLE) ×4 IMPLANT
TRAY DSU PREP LF (CUSTOM PROCEDURE TRAY) ×1 IMPLANT
TUBE CONNECTING 20X1/4 (TUBING) IMPLANT
TUBE SALEM SUMP 16 FR W/ARV (TUBING) IMPLANT
YANKAUER SUCT BULB TIP NO VENT (SUCTIONS) ×2 IMPLANT

## 2016-12-22 NOTE — Transfer of Care (Signed)
Immediate Anesthesia Transfer of Care Note  Patient: Jessica Chavez  Procedure(s) Performed: Procedure(s): LEFT MAXILLARY ANTROSTOMY REMOVAL TISSUE BIOPSY OF MASS (N/A) BIOPSY OF MASS (N/A)  Patient Location: PACU  Anesthesia Type:General  Level of Consciousness: sedated  Airway & Oxygen Therapy: Patient Spontanous Breathing and Patient connected to face mask oxygen  Post-op Assessment: Report given to RN and Post -op Vital signs reviewed and stable  Post vital signs: Reviewed and stable  Last Vitals:  Vitals:   12/22/16 0919 12/22/16 1042  BP: (!) 148/86 (!) 143/83  Pulse: 80 85  Resp: 20 12  Temp: 36.7 C 36.5 C  SpO2: 98% 94%    Last Pain:  Vitals:   12/22/16 1042  TempSrc:   PainSc: Asleep         Complications: No apparent anesthesia complications

## 2016-12-22 NOTE — Op Note (Signed)
OPERATIVE REPORT  DATE OF SURGERY: 12/22/2016  PATIENT:  Jessica Chavez,  66 y.o. female  PRE-OPERATIVE DIAGNOSIS:  SINUSITIS,SINUS MASS  POST-OPERATIVE DIAGNOSIS:  SINUSITIS,SINUS MASS  PROCEDURE:  Procedure(s): LEFT MAXILLARY ANTROSTOMY REMOVAL TISSUE BIOPSY OF MASS BIOPSY OF MASS  SURGEON:  Beckie Salts, MD  ASSISTANTS: None  ANESTHESIA:   General   EBL:  10 ml  DRAINS: None  LOCAL MEDICATIONS USED:  1% Xylocaine with epinephrine  SPECIMEN:  Left maxillary sinus mass  COUNTS:  Correct  PROCEDURE DETAILS: The patient was taken to the operating room and placed on the operating table in the supine position. Following induction of general endotracheal anesthesia, the left side of the face was draped in a standard fashion. Afrin spray was used preoperatively in the nasal cavities. 0 nasal endoscope was used to visualize the left nasal cavity and the middle meatus area in particular. Local anesthetic solution was infiltrated into the superior and posterior attachments of the middle turbinate on the left and the lateral nasal wall. Afrin pledget was placed for several minutes.  A sickle knife was used to remove the uncinate process. This exposed the fontanelle and a curved suction was then entered into the maxillary antrum. Using a 30 nasal endoscope the antrostomy was enlarged posteriorly and inferiorly using through cut forceps and anteriorly using the backbiting forceps. A 70 nasal endoscope was then used to visualize the maxillary antrum specifically the superior aspect where the tumor was found. There was a hard bony prominence underlying the mucosa and some surrounding thickened soft tissue. Multiple biopsies were taken from this soft tissue area. There was minimal bleeding. The orbit was not exposed. Biopsies were sent for pathologic evaluation. Afrin pledgets were placed in the left middle meatus and removed just prior to extubation. The patient was awakened extubated and  transferred to recovery in stable condition.    PATIENT DISPOSITION:  To PACU, stable

## 2016-12-22 NOTE — Interval H&P Note (Signed)
History and Physical Interval Note:  12/22/2016 9:05 AM  Jessica Chavez  has presented today for surgery, with the diagnosis of SINUSITIS,SINUS MASS  The various methods of treatment have been discussed with the patient and family. After consideration of risks, benefits and other options for treatment, the patient has consented to  Procedure(s): LEFT MAXILLARY ANTROSTOMY REMOVAL TISSUE BIOPSY OF MASS (N/A) BIOPSY OF MASS (N/A) as a surgical intervention .  The patient's history has been reviewed, patient examined, no change in status, stable for surgery.  I have reviewed the patient's chart and labs.  Questions were answered to the patient's satisfaction.     [name]

## 2016-12-22 NOTE — Anesthesia Preprocedure Evaluation (Addendum)
Anesthesia Evaluation  Patient identified by MRN, date of birth, ID band Patient awake    Reviewed: Allergy & Precautions, NPO status , Patient's Chart, lab work & pertinent test results  History of Anesthesia Complications (+) PONV  Airway Mallampati: I  TM Distance: >3 FB Neck ROM: Full    Dental  (+) Dental Advisory Given, Teeth Intact, Caps, Implants   Pulmonary neg pulmonary ROS,    breath sounds clear to auscultation       Cardiovascular negative cardio ROS   Rhythm:Regular Rate:Normal     Neuro/Psych Depression negative neurological ROS     GI/Hepatic Neg liver ROS, GERD  Medicated and Controlled,  Endo/Other  negative endocrine ROS  Renal/GU negative Renal ROS     Musculoskeletal  (+) Arthritis ,   Abdominal   Peds  Hematology negative hematology ROS (+)   Anesthesia Other Findings   Reproductive/Obstetrics H/o uterine and endometrial cancer                            Anesthesia Physical Anesthesia Plan  ASA: II  Anesthesia Plan: General   Post-op Pain Management:    Induction: Intravenous  PONV Risk Score and Plan: 4 or greater and Ondansetron, Dexamethasone, Midazolam and Scopolamine patch - Pre-op  Airway Management Planned: Oral ETT  Additional Equipment:   Intra-op Plan:   Post-operative Plan: Extubation in OR  Informed Consent: I have reviewed the patients History and Physical, chart, labs and discussed the procedure including the risks, benefits and alternatives for the proposed anesthesia with the patient or authorized representative who has indicated his/her understanding and acceptance.   Dental advisory given  Plan Discussed with: CRNA and Surgeon  Anesthesia Plan Comments: (Plan routine monitors, GETA)        Anesthesia Quick Evaluation

## 2016-12-22 NOTE — Anesthesia Postprocedure Evaluation (Signed)
Anesthesia Post Note  Patient: Zuleyma Scharf Kubicek  Procedure(s) Performed: Procedure(s) (LRB): LEFT MAXILLARY ANTROSTOMY REMOVAL TISSUE BIOPSY OF MASS (N/A) BIOPSY OF MASS (N/A)     Patient location during evaluation: PACU Anesthesia Type: General Level of consciousness: awake and alert, patient cooperative and oriented Pain management: pain level controlled Vital Signs Assessment: post-procedure vital signs reviewed and stable Respiratory status: spontaneous breathing, nonlabored ventilation and respiratory function stable Cardiovascular status: blood pressure returned to baseline and stable Postop Assessment: no signs of nausea or vomiting Anesthetic complications: no    Last Vitals:  Vitals:   12/22/16 1130 12/22/16 1145  BP: (!) 148/88 (!) 146/88  Pulse: 67 64  Resp: 14 (!) 7  Temp:    SpO2: 100% 99%    Last Pain:  Vitals:   12/22/16 1145  TempSrc:   PainSc: 4                  Morene Cecilio,E. Quanta Roher

## 2016-12-22 NOTE — Discharge Instructions (Signed)
Use saline nasal spray on the left hand side, 10-20 times per day.  Use Afrin spray only if there is bleeding.  Use Tylenol and/or Motrin for any discomfort.   Post Anesthesia Home Care Instructions  Activity: Get plenty of rest for the remainder of the day. A responsible individual must stay with you for 24 hours following the procedure.  For the next 24 hours, DO NOT: -Drive a car -Paediatric nurse -Drink alcoholic beverages -Take any medication unless instructed by your physician -Make any legal decisions or sign important papers.  Meals: Start with liquid foods such as gelatin or soup. Progress to regular foods as tolerated. Avoid greasy, spicy, heavy foods. If nausea and/or vomiting occur, drink only clear liquids until the nausea and/or vomiting subsides. Call your physician if vomiting continues.  Special Instructions/Symptoms: Your throat may feel dry or sore from the anesthesia or the breathing tube placed in your throat during surgery. If this causes discomfort, gargle with warm salt water. The discomfort should disappear within 24 hours.  If you had a scopolamine patch placed behind your ear for the management of post- operative nausea and/or vomiting:  1. The medication in the patch is effective for 72 hours, after which it should be removed.  Wrap patch in a tissue and discard in the trash. Wash hands thoroughly with soap and water. 2. You may remove the patch earlier than 72 hours if you experience unpleasant side effects which may include dry mouth, dizziness or visual disturbances. 3. Avoid touching the patch. Wash your hands with soap and water after contact with the patch.

## 2016-12-22 NOTE — Anesthesia Procedure Notes (Signed)
Procedure Name: Intubation Date/Time: 12/22/2016 9:48 AM Performed by: Maryella Shivers Pre-anesthesia Checklist: Patient identified, Emergency Drugs available, Suction available and Patient being monitored Patient Re-evaluated:Patient Re-evaluated prior to induction Oxygen Delivery Method: Circle system utilized Preoxygenation: Pre-oxygenation with 100% oxygen Induction Type: IV induction Ventilation: Mask ventilation without difficulty Laryngoscope Size: Mac and 3 Grade View: Grade I Tube type: Oral Tube size: 7.0 mm Number of attempts: 1 Airway Equipment and Method: Stylet and Oral airway Placement Confirmation: ETT inserted through vocal cords under direct vision,  positive ETCO2 and breath sounds checked- equal and bilateral Secured at: 20 cm Tube secured with: Tape Dental Injury: Teeth and Oropharynx as per pre-operative assessment

## 2016-12-23 ENCOUNTER — Encounter (HOSPITAL_BASED_OUTPATIENT_CLINIC_OR_DEPARTMENT_OTHER): Payer: Self-pay | Admitting: Otolaryngology

## 2016-12-23 NOTE — Addendum Note (Signed)
Addendum  created 12/23/16 1210 by Tawni Millers, CRNA   Charge Capture section accepted

## 2017-01-10 ENCOUNTER — Ambulatory Visit (INDEPENDENT_AMBULATORY_CARE_PROVIDER_SITE_OTHER): Payer: Medicare Other | Admitting: Internal Medicine

## 2017-01-10 ENCOUNTER — Encounter: Payer: Self-pay | Admitting: Internal Medicine

## 2017-01-10 VITALS — BP 128/70 | HR 80 | Ht 65.25 in | Wt 178.0 lb

## 2017-01-10 DIAGNOSIS — K5904 Chronic idiopathic constipation: Secondary | ICD-10-CM | POA: Diagnosis not present

## 2017-01-10 DIAGNOSIS — K219 Gastro-esophageal reflux disease without esophagitis: Secondary | ICD-10-CM

## 2017-01-10 MED ORDER — AMITIZA 8 MCG PO CAPS: 8.0000 ug | ORAL_CAPSULE | Freq: Two times a day (BID) | ORAL | 0 refills | Status: DC

## 2017-01-10 MED ORDER — OMEPRAZOLE 20 MG PO CPDR: 20.0000 mg | DELAYED_RELEASE_CAPSULE | Freq: Every day | ORAL | 2 refills | Status: DC

## 2017-01-10 NOTE — Patient Instructions (Signed)
Increase your Amitiza to 8 mcg twice daily.  We have sent the following medications to your pharmacy for you to pick up at your convenience: omeprazole 20 mg once daily  Please follow up with Dr Hilarie Fredrickson in 3-4 months.  If you are age 68 or older, your body mass index should be between 23-30. Your Body mass index is 29.39 kg/m. If this is out of the aforementioned range listed, please consider follow up with your Primary Care Provider.  If you are age 72 or younger, your body mass index should be between 19-25. Your Body mass index is 29.39 kg/m. If this is out of the aformentioned range listed, please consider follow up with your Primary Care Provider.

## 2017-01-10 NOTE — Progress Notes (Signed)
Patient ID: Josanna Hefel Madej, female   DOB: 1950-09-13, 66 y.o.   MRN: [id number] HPI: Slyvia Lartigue is a 66 year old female with a past medical history of chronic constipation, GERD with history of dysphagia, history of uterine cancer status post hysterectomy, hyperlipidemia who is seen in consultation at the request of Dr. Lacinda Axon to evaluate chronic constipation. She is here alone today.   She reports that she has been using Amitiza 8 g once daily. This was started several years ago by her gastroenterologist in [place]. She reports that she has ongoing issues with constipation where she will "feel clogged up" and not have a bowel movement for 3-4 days. She will then have a large complete bowel movement after which she will have regular bowel movements for several days until the cycle starts over again. When she feels constipated she has bilateral lower abdominal crampy discomfort. This is completely alleviated after bowel movement. She denies seeing blood in her stool and melena. She's had prior colonoscopy in 2014 and 2016.  Colonoscopy from 2014 was performed for screening and this procedure report is available. This showed mild diverticulosis in the sigmoid. Five-year recall was recommended. She reports then having another colonoscopy in August 2016 by Dr. Napoleon Form but I do not have the report available today.  She has a history of reflux disease and prior esophageal dilation. She is taking omeprazole 20 mg but not on a regular basis. She's been on this medicine for many years but recently more inconsistent with it. She will try Tums on occasion for heartburn control. Previously she had dysphagia improved after dilation. She very rarely feels intermittent solid food "catching" but this is been quite rare. Symptoms have not recurred to the extent prior to dilation.  Her husband died of colon cancer 2 years ago.  Past Medical History:  Diagnosis Date  . Chicken pox   . Colon polyps   .  Constipation   . Diverticulitis   . Endometrial cancer ([place])   . GERD (gastroesophageal reflux disease)   . Heartburn   . Hiatal hernia   . Hot flashes   . Hyperlipidemia   . Osteoarthritis   . PONV (postoperative nausea and vomiting)    pt gets very nauseated, usually takes propofol for sedation  . Uterine cancer [facility])     Past Surgical History:  Procedure Laterality Date  . ABDOMINAL HYSTERECTOMY  2000  . APPENDECTOMY  2000  . BREAST BIOPSY    . CHOLECYSTECTOMY  1984  . HEMORROIDECTOMY  1973  . MASS EXCISION N/A 12/22/2016   Procedure: BIOPSY OF MASS;  Surgeon: Izora Gala, MD;  Location: [place] [facility];  Service: ENT;  Laterality: N/A;  . MAXILLARY ANTROSTOMY N/A 12/22/2016   Procedure: LEFT MAXILLARY ANTROSTOMY REMOVAL TISSUE BIOPSY OF MASS;  Surgeon: Izora Gala, MD;  Location: [place];  Service: ENT;  Laterality: N/A;  . OOPHORECTOMY    . REPLACEMENT TOTAL KNEE Left 2012  . REPLACEMENT TOTAL KNEE Right 2013    Outpatient Medications Prior to Visit  Medication Sig Dispense Refill  . AMITIZA 8 MCG capsule TAKE 1 CAPSULE DAILY WITH  BREAKFAST 90 capsule 1  . Calcium Carbonate-Vit D-Min (CALCIUM 1200 PO) Take by mouth.    . Cyanocobalamin (B-12 PO) Take by mouth.    . estradiol (VIVELLE-DOT) 0.025 MG/24HR Place 0.5 patches onto the skin 2 (two) times a week. Patient is only taking once a week.     . fluticasone (FLONASE) 50 MCG/ACT nasal  spray Place 2 sprays into both nostrils daily. 16 g 1  . LUTEIN PO Take by mouth.    . Multiple Vitamin (MULTIVITAMIN) tablet Take 1 tablet by mouth daily.    . Omega-3 Fatty Acids (OMEGA-3 FISH OIL PO) Take by mouth.    Marland Kitchen omeprazole (PRILOSEC) 20 MG capsule Take 20 mg by mouth daily.    . pravastatin (PRAVACHOL) 20 MG tablet TAKE 1 TABLET DAILY 90 tablet 0   No facility-administered medications prior to visit.     Allergies  Allergen Reactions  . Hydrocodone Nausea And Vomiting  . Penicillins Rash     Family History  Problem Relation Age of Onset  . Endometriosis Mother   . Breast cancer Mother   . Hyperlipidemia Mother   . Lymphoma Mother   . Osteoarthritis Mother   . COPD Mother   . Cataracts Mother   . Glaucoma Mother   . Hyperlipidemia Father   . Heart disease Father   . Hypertension Father   . Heart disease Maternal Grandmother   . Heart disease Maternal Grandfather     Social History  Substance Use Topics  . Smoking status: Never Smoker  . Smokeless tobacco: Never Used  . Alcohol use No    ROS: As per history of present illness, otherwise negative  Ht 5' 5.25" (1.657 m) Comment: height measured without shoes  Wt 178 lb (80.7 kg)   BMI 29.39 kg/m  Constitutional: Well-developed and well-nourished. No distress. HEENT: Normocephalic and atraumatic. Oropharynx is clear and moist. Conjunctivae are normal.  No scleral icterus. Neck: Neck supple. Trachea midline. Cardiovascular: Normal rate, regular rhythm and intact distal pulses. No M/R/G Pulmonary/chest: Effort normal and breath sounds normal. No wheezing, rales or rhonchi. Abdominal: Soft, nontender, nondistended. Bowel sounds active throughout. There are no masses palpable. No hepatosplenomegaly. Extremities: no clubbing, cyanosis, or edema Neurological: Alert and oriented to person place and time. Skin: Skin is warm and dry.  Psychiatric: Normal mood and affect. Behavior is normal.  RELEVANT LABS AND IMAGING: CBC    Component Value Date/Time   WBC 4.1 07/08/2016 1026   RBC 5.04 07/08/2016 1026   HGB 15.5 (H) 07/08/2016 1026   HCT 45.3 07/08/2016 1026   PLT 218.0 07/08/2016 1026   MCV 89.9 07/08/2016 1026   MCHC 34.2 07/08/2016 1026   RDW 12.9 07/08/2016 1026    CMP     Component Value Date/Time   NA 141 07/08/2016 1026   K 4.0 07/08/2016 1026   CL 104 07/08/2016 1026   CO2 31 07/08/2016 1026   GLUCOSE 107 (H) 07/08/2016 1026   BUN 10 07/08/2016 1026   CREATININE 0.76 07/08/2016 1026    CALCIUM 9.5 07/08/2016 1026   PROT 7.1 07/08/2016 1026   ALBUMIN 4.4 07/08/2016 1026   AST 18 07/08/2016 1026   ALT 21 07/08/2016 1026   ALKPHOS 70 07/08/2016 1026   BILITOT 0.7 07/08/2016 1026    ASSESSMENT/PLAN: 66 year old female with a past medical history of chronic constipation, GERD with history of dysphagia, history of uterine cancer status post hysterectomy, hyperlipidemia who is seen in consultation at the request of Dr. Lacinda Axon to evaluate chronic constipation.  1. CIC -- patient has history of chronic constipation. We discussed how Amitiza is more effective when used twice daily. I'm going to increase Amitiza 8 g twice daily with food. We also discussed how she may need a higher dose, 24 g twice a day. She will try 8 g twice a day for 1-2 weeks  and call me with a symptom update. Does titrate as necessary. She was given a co-pay card hopefully to lower the cost of this medication for her. She is asked to notify me if this is cost prohibitive.  2. CRC screening  -- we have requested her colonoscopy from 2016 for my review. We'll base repeat colonoscopy interval on the findings of this exam.   3. GERD -- we discussed the risk benefits and alternatives to chronic PPI use. She has had a bone scan and does not have osteopenia. After discussion regarding PPI versus H2 blocker she prefers to continue omeprazole. She will continue omeprazole 20 mg daily. Repeat EGD and dilation can be performed as needed for dysphagia. Will also review her EGD report obtained from Dr. Hassell Done.  3-4 month follow-up, sooner if needed     OH:YWVP, Yvetta Coder [address] [place] [address]

## 2017-01-13 DIAGNOSIS — H52221 Regular astigmatism, right eye: Secondary | ICD-10-CM | POA: Diagnosis not present

## 2017-01-13 DIAGNOSIS — H25811 Combined forms of age-related cataract, right eye: Secondary | ICD-10-CM | POA: Diagnosis not present

## 2017-01-27 DIAGNOSIS — H52222 Regular astigmatism, left eye: Secondary | ICD-10-CM | POA: Diagnosis not present

## 2017-01-27 DIAGNOSIS — H25812 Combined forms of age-related cataract, left eye: Secondary | ICD-10-CM | POA: Diagnosis not present

## 2017-02-04 ENCOUNTER — Other Ambulatory Visit: Payer: Self-pay | Admitting: *Deleted

## 2017-02-04 MED ORDER — OMEPRAZOLE 20 MG PO CPDR: 20.0000 mg | DELAYED_RELEASE_CAPSULE | Freq: Every day | ORAL | 0 refills | Status: DC

## 2017-02-09 DIAGNOSIS — J349 Unspecified disorder of nose and nasal sinuses: Secondary | ICD-10-CM | POA: Diagnosis not present

## 2017-02-09 DIAGNOSIS — R200 Anesthesia of skin: Secondary | ICD-10-CM | POA: Diagnosis not present

## 2017-02-11 ENCOUNTER — Ambulatory Visit (INDEPENDENT_AMBULATORY_CARE_PROVIDER_SITE_OTHER): Payer: Medicare Other

## 2017-02-11 DIAGNOSIS — Z23 Encounter for immunization: Secondary | ICD-10-CM | POA: Diagnosis not present

## 2017-02-16 DIAGNOSIS — C8591 Non-Hodgkin lymphoma, unspecified, lymph nodes of head, face, and neck: Secondary | ICD-10-CM | POA: Diagnosis not present

## 2017-02-16 DIAGNOSIS — R220 Localized swelling, mass and lump, head: Secondary | ICD-10-CM | POA: Diagnosis not present

## 2017-02-16 DIAGNOSIS — J3489 Other specified disorders of nose and nasal sinuses: Secondary | ICD-10-CM | POA: Diagnosis not present

## 2017-02-16 DIAGNOSIS — J329 Chronic sinusitis, unspecified: Secondary | ICD-10-CM | POA: Diagnosis not present

## 2017-02-21 DIAGNOSIS — R200 Anesthesia of skin: Secondary | ICD-10-CM | POA: Diagnosis not present

## 2017-02-21 DIAGNOSIS — M899 Disorder of bone, unspecified: Secondary | ICD-10-CM | POA: Diagnosis not present

## 2017-03-02 ENCOUNTER — Other Ambulatory Visit: Payer: Self-pay | Admitting: Otolaryngology

## 2017-03-02 DIAGNOSIS — R200 Anesthesia of skin: Secondary | ICD-10-CM

## 2017-03-02 DIAGNOSIS — R930 Abnormal findings on diagnostic imaging of skull and head, not elsewhere classified: Secondary | ICD-10-CM

## 2017-03-09 DIAGNOSIS — R930 Abnormal findings on diagnostic imaging of skull and head, not elsewhere classified: Secondary | ICD-10-CM | POA: Diagnosis not present

## 2017-03-09 DIAGNOSIS — R200 Anesthesia of skin: Secondary | ICD-10-CM | POA: Diagnosis not present

## 2017-03-10 DIAGNOSIS — J329 Chronic sinusitis, unspecified: Secondary | ICD-10-CM | POA: Diagnosis not present

## 2017-03-10 DIAGNOSIS — Z90710 Acquired absence of both cervix and uterus: Secondary | ICD-10-CM | POA: Diagnosis not present

## 2017-03-10 DIAGNOSIS — Z8542 Personal history of malignant neoplasm of other parts of uterus: Secondary | ICD-10-CM | POA: Diagnosis not present

## 2017-03-10 DIAGNOSIS — H02401 Unspecified ptosis of right eyelid: Secondary | ICD-10-CM | POA: Diagnosis not present

## 2017-03-10 DIAGNOSIS — K219 Gastro-esophageal reflux disease without esophagitis: Secondary | ICD-10-CM | POA: Diagnosis not present

## 2017-03-10 DIAGNOSIS — R220 Localized swelling, mass and lump, head: Secondary | ICD-10-CM | POA: Diagnosis not present

## 2017-03-10 DIAGNOSIS — J3489 Other specified disorders of nose and nasal sinuses: Secondary | ICD-10-CM | POA: Diagnosis not present

## 2017-03-10 DIAGNOSIS — J320 Chronic maxillary sinusitis: Secondary | ICD-10-CM | POA: Diagnosis not present

## 2017-03-10 DIAGNOSIS — R200 Anesthesia of skin: Secondary | ICD-10-CM | POA: Diagnosis not present

## 2017-03-10 DIAGNOSIS — J343 Hypertrophy of nasal turbinates: Secondary | ICD-10-CM | POA: Diagnosis not present

## 2017-03-10 DIAGNOSIS — J342 Deviated nasal septum: Secondary | ICD-10-CM | POA: Diagnosis not present

## 2017-03-13 ENCOUNTER — Other Ambulatory Visit: Payer: Medicare Other

## 2017-03-15 ENCOUNTER — Other Ambulatory Visit: Payer: Self-pay | Admitting: Family Medicine

## 2017-03-15 DIAGNOSIS — J3489 Other specified disorders of nose and nasal sinuses: Secondary | ICD-10-CM | POA: Diagnosis not present

## 2017-03-15 DIAGNOSIS — Z1379 Encounter for other screening for genetic and chromosomal anomalies: Secondary | ICD-10-CM | POA: Diagnosis not present

## 2017-03-15 DIAGNOSIS — Z961 Presence of intraocular lens: Secondary | ICD-10-CM | POA: Diagnosis not present

## 2017-03-23 DIAGNOSIS — J0100 Acute maxillary sinusitis, unspecified: Secondary | ICD-10-CM | POA: Diagnosis not present

## 2017-04-21 DIAGNOSIS — Z114 Encounter for screening for human immunodeficiency virus [HIV]: Secondary | ICD-10-CM | POA: Diagnosis not present

## 2017-04-21 DIAGNOSIS — Z1159 Encounter for screening for other viral diseases: Secondary | ICD-10-CM | POA: Diagnosis not present

## 2017-04-21 DIAGNOSIS — C8209 Follicular lymphoma grade I, extranodal and solid organ sites: Secondary | ICD-10-CM | POA: Diagnosis not present

## 2017-04-21 DIAGNOSIS — D704 Cyclic neutropenia: Secondary | ICD-10-CM | POA: Diagnosis not present

## 2017-04-21 DIAGNOSIS — J3489 Other specified disorders of nose and nasal sinuses: Secondary | ICD-10-CM | POA: Diagnosis not present

## 2017-04-21 DIAGNOSIS — F411 Generalized anxiety disorder: Secondary | ICD-10-CM | POA: Diagnosis not present

## 2017-04-22 ENCOUNTER — Other Ambulatory Visit: Payer: Self-pay | Admitting: Internal Medicine

## 2017-04-26 DIAGNOSIS — I517 Cardiomegaly: Secondary | ICD-10-CM | POA: Diagnosis not present

## 2017-04-26 DIAGNOSIS — R931 Abnormal findings on diagnostic imaging of heart and coronary circulation: Secondary | ICD-10-CM | POA: Diagnosis not present

## 2017-04-26 DIAGNOSIS — C8209 Follicular lymphoma grade I, extranodal and solid organ sites: Secondary | ICD-10-CM | POA: Diagnosis not present

## 2017-05-04 DIAGNOSIS — C8209 Follicular lymphoma grade I, extranodal and solid organ sites: Secondary | ICD-10-CM | POA: Diagnosis not present

## 2017-05-10 DIAGNOSIS — C8209 Follicular lymphoma grade I, extranodal and solid organ sites: Secondary | ICD-10-CM | POA: Diagnosis not present

## 2017-05-11 DIAGNOSIS — C8209 Follicular lymphoma grade I, extranodal and solid organ sites: Secondary | ICD-10-CM | POA: Diagnosis not present

## 2017-05-15 ENCOUNTER — Other Ambulatory Visit: Payer: Self-pay | Admitting: Internal Medicine

## 2017-05-16 DIAGNOSIS — C8209 Follicular lymphoma grade I, extranodal and solid organ sites: Secondary | ICD-10-CM | POA: Diagnosis not present

## 2017-05-16 DIAGNOSIS — C8299 Follicular lymphoma, unspecified, extranodal and solid organ sites: Secondary | ICD-10-CM | POA: Diagnosis not present

## 2017-05-18 DIAGNOSIS — C8299 Follicular lymphoma, unspecified, extranodal and solid organ sites: Secondary | ICD-10-CM | POA: Diagnosis not present

## 2017-05-20 DIAGNOSIS — M9903 Segmental and somatic dysfunction of lumbar region: Secondary | ICD-10-CM | POA: Diagnosis not present

## 2017-05-20 DIAGNOSIS — M9905 Segmental and somatic dysfunction of pelvic region: Secondary | ICD-10-CM | POA: Diagnosis not present

## 2017-05-20 DIAGNOSIS — M9902 Segmental and somatic dysfunction of thoracic region: Secondary | ICD-10-CM | POA: Diagnosis not present

## 2017-05-23 DIAGNOSIS — C8299 Follicular lymphoma, unspecified, extranodal and solid organ sites: Secondary | ICD-10-CM | POA: Diagnosis not present

## 2017-05-25 DIAGNOSIS — M9905 Segmental and somatic dysfunction of pelvic region: Secondary | ICD-10-CM | POA: Diagnosis not present

## 2017-05-25 DIAGNOSIS — M9902 Segmental and somatic dysfunction of thoracic region: Secondary | ICD-10-CM | POA: Diagnosis not present

## 2017-05-25 DIAGNOSIS — M9903 Segmental and somatic dysfunction of lumbar region: Secondary | ICD-10-CM | POA: Diagnosis not present

## 2017-05-26 DIAGNOSIS — C8299 Follicular lymphoma, unspecified, extranodal and solid organ sites: Secondary | ICD-10-CM | POA: Diagnosis not present

## 2017-05-27 DIAGNOSIS — C8299 Follicular lymphoma, unspecified, extranodal and solid organ sites: Secondary | ICD-10-CM | POA: Diagnosis not present

## 2017-05-30 ENCOUNTER — Ambulatory Visit: Payer: Medicare Other | Admitting: Internal Medicine

## 2017-05-30 DIAGNOSIS — C8299 Follicular lymphoma, unspecified, extranodal and solid organ sites: Secondary | ICD-10-CM | POA: Diagnosis not present

## 2017-05-31 DIAGNOSIS — C8299 Follicular lymphoma, unspecified, extranodal and solid organ sites: Secondary | ICD-10-CM | POA: Diagnosis not present

## 2017-06-01 DIAGNOSIS — C8299 Follicular lymphoma, unspecified, extranodal and solid organ sites: Secondary | ICD-10-CM | POA: Diagnosis not present

## 2017-06-02 DIAGNOSIS — C8299 Follicular lymphoma, unspecified, extranodal and solid organ sites: Secondary | ICD-10-CM | POA: Diagnosis not present

## 2017-06-03 DIAGNOSIS — C8299 Follicular lymphoma, unspecified, extranodal and solid organ sites: Secondary | ICD-10-CM | POA: Diagnosis not present

## 2017-06-06 DIAGNOSIS — C8299 Follicular lymphoma, unspecified, extranodal and solid organ sites: Secondary | ICD-10-CM | POA: Diagnosis not present

## 2017-06-07 DIAGNOSIS — C8299 Follicular lymphoma, unspecified, extranodal and solid organ sites: Secondary | ICD-10-CM | POA: Diagnosis not present

## 2017-06-08 DIAGNOSIS — C8299 Follicular lymphoma, unspecified, extranodal and solid organ sites: Secondary | ICD-10-CM | POA: Diagnosis not present

## 2017-06-09 DIAGNOSIS — C8299 Follicular lymphoma, unspecified, extranodal and solid organ sites: Secondary | ICD-10-CM | POA: Diagnosis not present

## 2017-06-10 DIAGNOSIS — C8209 Follicular lymphoma grade I, extranodal and solid organ sites: Secondary | ICD-10-CM | POA: Diagnosis not present

## 2017-06-10 DIAGNOSIS — C8299 Follicular lymphoma, unspecified, extranodal and solid organ sites: Secondary | ICD-10-CM | POA: Diagnosis not present

## 2017-06-13 DIAGNOSIS — C8299 Follicular lymphoma, unspecified, extranodal and solid organ sites: Secondary | ICD-10-CM | POA: Diagnosis not present

## 2017-06-13 DIAGNOSIS — C8209 Follicular lymphoma grade I, extranodal and solid organ sites: Secondary | ICD-10-CM | POA: Diagnosis not present

## 2017-06-14 ENCOUNTER — Other Ambulatory Visit: Payer: Self-pay | Admitting: Internal Medicine

## 2017-06-14 DIAGNOSIS — E785 Hyperlipidemia, unspecified: Secondary | ICD-10-CM

## 2017-06-14 DIAGNOSIS — C8209 Follicular lymphoma grade I, extranodal and solid organ sites: Secondary | ICD-10-CM | POA: Diagnosis not present

## 2017-06-14 DIAGNOSIS — C8299 Follicular lymphoma, unspecified, extranodal and solid organ sites: Secondary | ICD-10-CM | POA: Diagnosis not present

## 2017-06-14 MED ORDER — PRAVASTATIN SODIUM 20 MG PO TABS: 20.0000 mg | ORAL_TABLET | Freq: Every day | ORAL | 0 refills | Status: AC

## 2017-06-15 DIAGNOSIS — C8299 Follicular lymphoma, unspecified, extranodal and solid organ sites: Secondary | ICD-10-CM | POA: Diagnosis not present

## 2017-06-15 DIAGNOSIS — C8209 Follicular lymphoma grade I, extranodal and solid organ sites: Secondary | ICD-10-CM | POA: Diagnosis not present

## 2017-06-27 ENCOUNTER — Ambulatory Visit: Payer: Medicare Other

## 2017-07-13 ENCOUNTER — Encounter: Payer: Medicare Other | Admitting: Internal Medicine

## 2017-07-17 ENCOUNTER — Other Ambulatory Visit: Payer: Self-pay | Admitting: Family Medicine

## 2017-07-18 DIAGNOSIS — E785 Hyperlipidemia, unspecified: Secondary | ICD-10-CM | POA: Diagnosis not present

## 2017-07-18 DIAGNOSIS — K5900 Constipation, unspecified: Secondary | ICD-10-CM | POA: Diagnosis not present

## 2017-07-18 DIAGNOSIS — C8209 Follicular lymphoma grade I, extranodal and solid organ sites: Secondary | ICD-10-CM | POA: Diagnosis not present

## 2017-07-18 DIAGNOSIS — R232 Flushing: Secondary | ICD-10-CM | POA: Diagnosis not present

## 2017-07-18 DIAGNOSIS — R200 Anesthesia of skin: Secondary | ICD-10-CM | POA: Diagnosis not present

## 2017-07-20 ENCOUNTER — Other Ambulatory Visit: Payer: Self-pay | Admitting: Internal Medicine

## 2017-07-20 DIAGNOSIS — R220 Localized swelling, mass and lump, head: Secondary | ICD-10-CM | POA: Diagnosis not present

## 2017-07-20 DIAGNOSIS — J0100 Acute maxillary sinusitis, unspecified: Secondary | ICD-10-CM | POA: Diagnosis not present

## 2017-08-02 DIAGNOSIS — C8209 Follicular lymphoma grade I, extranodal and solid organ sites: Secondary | ICD-10-CM | POA: Diagnosis not present

## 2017-08-08 ENCOUNTER — Encounter: Payer: Self-pay | Admitting: Internal Medicine

## 2017-08-08 ENCOUNTER — Ambulatory Visit (INDEPENDENT_AMBULATORY_CARE_PROVIDER_SITE_OTHER): Payer: Medicare Other | Admitting: Internal Medicine

## 2017-08-08 VITALS — BP 134/70 | HR 62 | Ht 65.25 in | Wt 180.0 lb

## 2017-08-08 DIAGNOSIS — Z86010 Personal history of colonic polyps: Secondary | ICD-10-CM | POA: Diagnosis not present

## 2017-08-08 DIAGNOSIS — K219 Gastro-esophageal reflux disease without esophagitis: Secondary | ICD-10-CM | POA: Diagnosis not present

## 2017-08-08 DIAGNOSIS — K5904 Chronic idiopathic constipation: Secondary | ICD-10-CM | POA: Diagnosis not present

## 2017-08-08 MED ORDER — LUBIPROSTONE 8 MCG PO CAPS: ORAL_CAPSULE | ORAL | 1 refills | Status: DC

## 2017-08-08 MED ORDER — SUPREP BOWEL PREP KIT 17.5-3.13-1.6 GM/177ML PO SOLN: 1.0000 | ORAL | 0 refills | Status: DC

## 2017-08-08 NOTE — Patient Instructions (Signed)
You have been scheduled for a colonoscopy. Please follow written instructions given to you at your visit today.  Please pick up your prep supplies at the pharmacy within the next 1-3 days. If you use inhalers (even only as needed), please bring them with you on the day of your procedure. Your physician has requested that you go to www.startemmi.com and enter the access code given to you at your visit today. This web site gives a general overview about your procedure. However, you should still follow specific instructions given to you by our office regarding your preparation for the procedure.  We have sent the following medications to your pharmacy for you to pick up at your convenience: amitiza 8 mg twice daily  When you need to purge your bowels periodically: Miralax 8 doses (8 capfuls or 136 grams) -can be purchased over the counter Mix in 32 ounces of gatorade and drink over 1-2 hours   Please follow up in the office in 1 year.  If you are age 47 or older, your body mass index should be between 23-30. Your Body mass index is 29.72 kg/m. If this is out of the aforementioned range listed, please consider follow up with your Primary Care Provider.  If you are age 59 or younger, your body mass index should be between 19-25. Your Body mass index is 29.72 kg/m. If this is out of the aformentioned range listed, please consider follow up with your Primary Care Provider.

## 2017-08-09 ENCOUNTER — Encounter: Payer: Self-pay | Admitting: Internal Medicine

## 2017-08-09 NOTE — Progress Notes (Signed)
Subjective:    Patient ID: Jessica Chavez, female    DOB: 13-Apr-1950, 67 y.o.   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  I initially saw her on 01/10/2017.  She is here alone today.  After her last visit she was having issues with lower abdominal pressure, intermittent discomfort but also ongoing constipation.  She was at that time using Amitiza 8 mcg daily and we increase this to twice daily.  With this she has had significant improvement and near resolution of her chronic constipation.  Bowel movements are occurring most days and are formed and more complete.  Occasionally she will feel abdominal fullness with some right lower quadrant pressure which is relieved with defecation.  On these days she wonders if a "colon cleanse" or bowel purge would improve the symptom.  No blood in her stool or melena.  We did receive previous colonoscopy records and in August 2016 she had 2 polyps removed one 10 mm adenoma/sessile serrated polyp was removed from the cecum.  3-year recall was recommended  She has completed radiation therapy at [facility] for follicular lymphoma involving her left maxillary sinus.  She is following very closely and will have a repeat CT PET scan in May 2019.  She had a PET CT scan from skull base to mid thigh in January which showed hypermetabolic uptake in the left maxillary bone but in no other locations.  GERD has been well controlled recently.  No recent dysphagia.  Review of Systems As per HPI, otherwise negative  Current Medications, Allergies, Past Medical History, Past Surgical History, Family History and Social History were reviewed in [employer]  record.     Objective:   Physical Exam BP 134/70   Pulse 62   Ht 5' 5.25" (1.657 m)   Wt 180 lb (81.6 kg)   BMI 29.72 kg/m  Constitutional: Well-developed and well-nourished. No distress. HEENT: Normocephalic and atraumatic. Oropharynx is clear and moist. Conjunctivae are normal.  No scleral icterus.   Neck: Neck supple. Trachea midline. Cardiovascular: Normal rate, regular rhythm and intact distal pulses. Pulmonary/chest: Effort normal and breath sounds normal. No wheezing, rales or rhonchi. Abdominal: Soft, nontender, nondistended. Bowel sounds active throughout.  Extremities: no clubbing, cyanosis, or edema Neurological: Alert and oriented to person place and time. Skin: Skin is warm and dry. Psychiatric: Normal mood and affect. Behavior is normal.      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   1. CIC --significantly improved symptoms with Amitiza when taking twice a day.  We will continue amities at 8 mg twice daily.  If she does have periods, which sound like her very infrequent, when she feels that a bowel purge would improve her overall constipation it is okay to use a MiraLAX bowel purge.  I instructed her if this is needed to use MiraLAX 8 doses in 32 ounces of Gatorade.  She would drink this solution over 2 to 3 hours which should produce a bowel purge.  If this  is needed on a regular or frequent basis she is asked to notify me and she voices understanding  2.  History of colon polyps --surveillance colonoscopy is recommended this August given she had an adenomatous polyp greater than 1 cm 3 years ago.  We discussed the risk, benefits and alternatives to colonoscopy and she wishes to proceed.   We scheduled this today.  3.  GERD --well controlled without alarm symptoms.  Continue current therapy.   Annual office follow-up, sooner if needed  25 minutes spent with the patient today. Greater than 50% was spent in counseling and coordination of care with the patient

## 2017-08-10 ENCOUNTER — Other Ambulatory Visit: Payer: Self-pay | Admitting: Internal Medicine

## 2017-08-22 DIAGNOSIS — Z961 Presence of intraocular lens: Secondary | ICD-10-CM | POA: Diagnosis not present

## 2017-08-22 DIAGNOSIS — H1852 Epithelial (juvenile) corneal dystrophy: Secondary | ICD-10-CM | POA: Diagnosis not present

## 2017-08-22 DIAGNOSIS — H18459 Nodular corneal degeneration, unspecified eye: Secondary | ICD-10-CM | POA: Diagnosis not present

## 2017-08-30 ENCOUNTER — Telehealth: Payer: Self-pay | Admitting: Internal Medicine

## 2017-08-30 MED ORDER — LUBIPROSTONE 8 MCG PO CAPS: ORAL_CAPSULE | ORAL | 0 refills | Status: DC

## 2017-08-30 MED ORDER — OMEPRAZOLE 20 MG PO CPDR: DELAYED_RELEASE_CAPSULE | ORAL | 1 refills | Status: DC

## 2017-08-30 NOTE — Telephone Encounter (Signed)
Noted  

## 2017-09-08 DIAGNOSIS — C8209 Follicular lymphoma grade I, extranodal and solid organ sites: Secondary | ICD-10-CM | POA: Diagnosis not present

## 2017-09-08 DIAGNOSIS — Z923 Personal history of irradiation: Secondary | ICD-10-CM | POA: Diagnosis not present

## 2017-09-08 DIAGNOSIS — Z08 Encounter for follow-up examination after completed treatment for malignant neoplasm: Secondary | ICD-10-CM | POA: Diagnosis not present

## 2017-09-08 DIAGNOSIS — Z8572 Personal history of non-Hodgkin lymphomas: Secondary | ICD-10-CM | POA: Diagnosis not present

## 2017-09-08 DIAGNOSIS — K117 Disturbances of salivary secretion: Secondary | ICD-10-CM | POA: Diagnosis not present

## 2017-09-21 DIAGNOSIS — R220 Localized swelling, mass and lump, head: Secondary | ICD-10-CM | POA: Diagnosis not present

## 2017-10-04 DIAGNOSIS — Z1231 Encounter for screening mammogram for malignant neoplasm of breast: Secondary | ICD-10-CM | POA: Diagnosis not present

## 2017-10-04 DIAGNOSIS — N958 Other specified menopausal and perimenopausal disorders: Secondary | ICD-10-CM | POA: Diagnosis not present

## 2017-10-04 DIAGNOSIS — Z124 Encounter for screening for malignant neoplasm of cervix: Secondary | ICD-10-CM | POA: Diagnosis not present

## 2017-10-04 DIAGNOSIS — M8588 Other specified disorders of bone density and structure, other site: Secondary | ICD-10-CM | POA: Diagnosis not present

## 2017-10-04 DIAGNOSIS — Z6829 Body mass index (BMI) 29.0-29.9, adult: Secondary | ICD-10-CM | POA: Diagnosis not present

## 2017-10-16 IMAGING — MR MR ORBITS WO/W CM
6 of 12 series · 19 of 48 positions shown · IV contrast (multihance)
Comparison: CT of the face 11/30/2016.

CLINICAL DATA: Numbness of LEFT side face for 2 weeks below eye. No
known injury.

EXAM:
MRI OF THE ORBITS WITHOUT AND WITH CONTRAST
TECHNIQUE: Multiplanar, multisequence MR imaging of the orbits was performed
both before and after the administration of intravenous contrast.
CONTRAST:  15mL MULTIHANCE GADOBENATE DIMEGLUMINE 529 MG/ML IV SOLN

[Series 7: FLAIR · axial · 3.0mm · 0.45mm/px · z∈[-56,+94]mm · 3 of 26 slices shown]
[im 1/26]
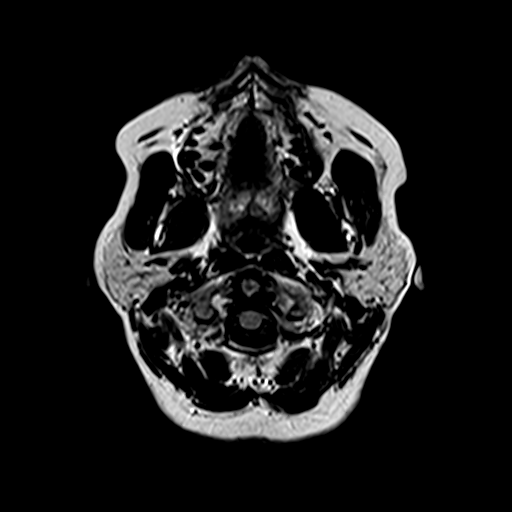
[im 13/26]
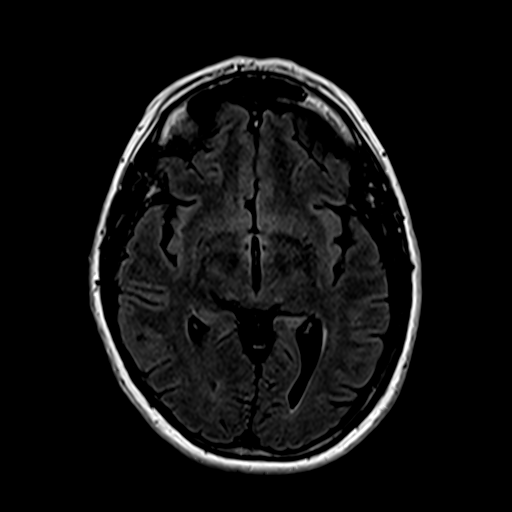
[im 26/26]
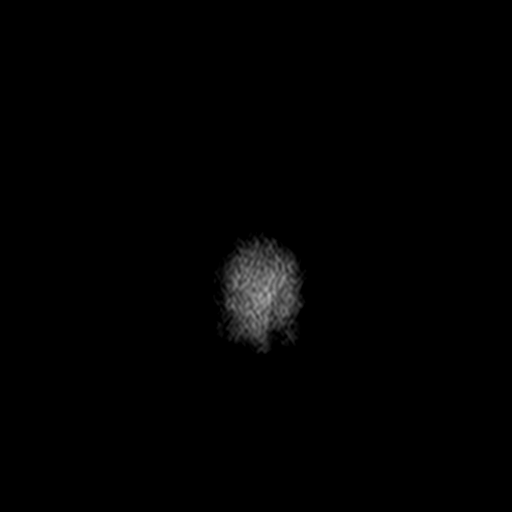

[Series 9: T1 · coronal · 3.0mm · 0.35mm/px · 3 of 24 slices shown (1 of 2)]
[im 1/24]
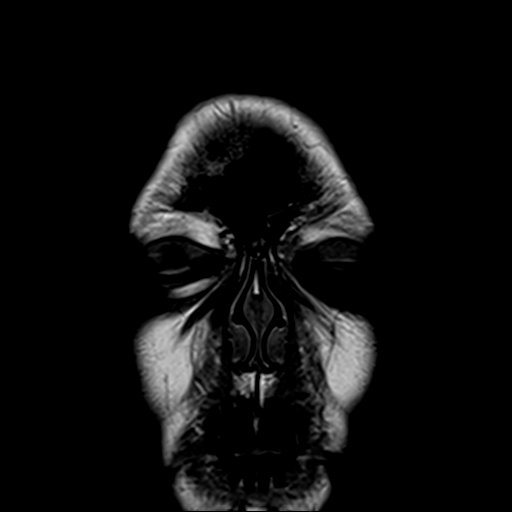
[im 12/24]
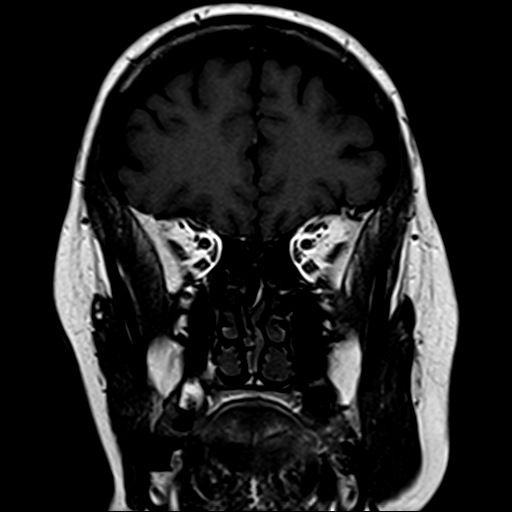
[im 24/24]
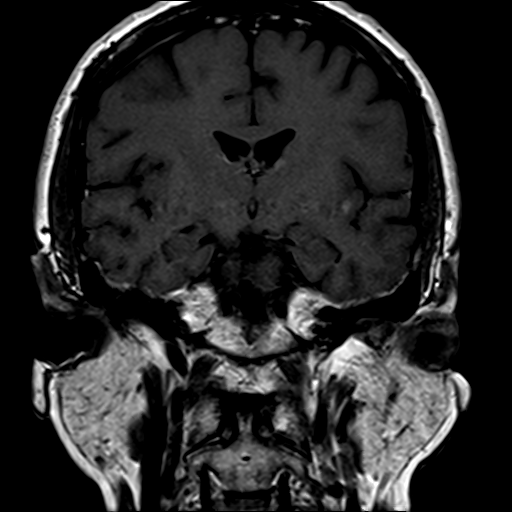

[Series 10: T2 fat-sat · coronal · 3.0mm · 0.35mm/px · 3 of 24 slices shown (1 of 2)]
[im 1/24]
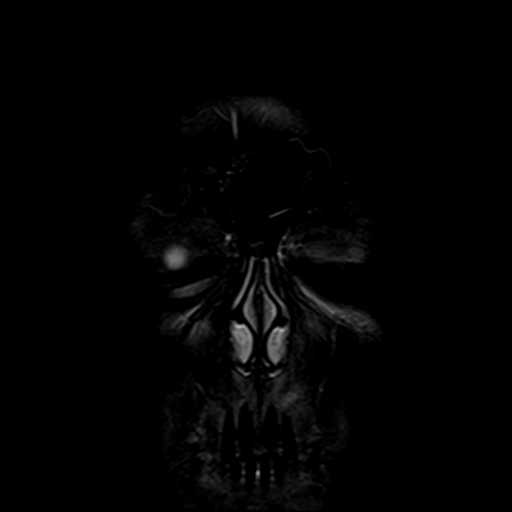
[im 12/24]
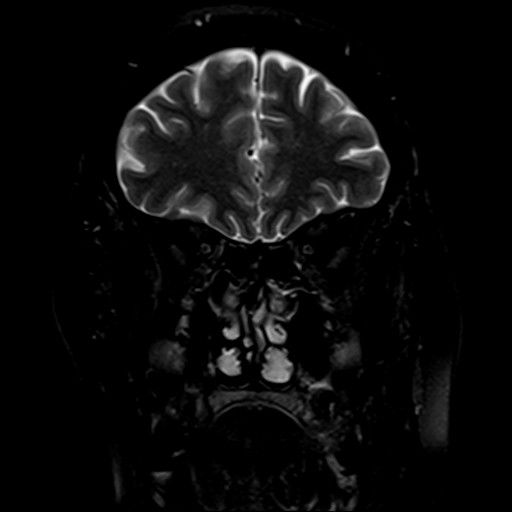
[im 24/24]
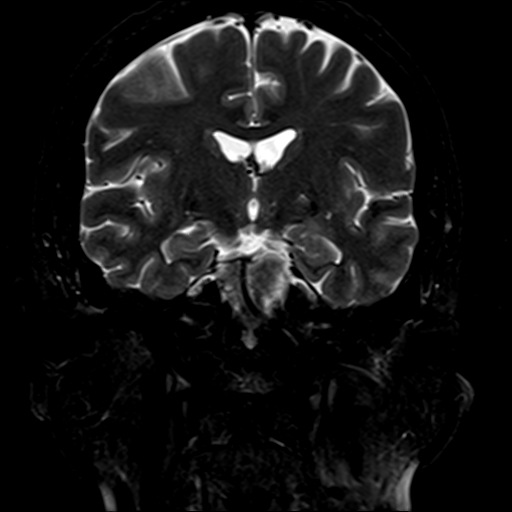

[Series 11: T1 · axial · 3.0mm · 0.35mm/px · z∈[-52,+34]mm · 4 of 30 slices shown (2 of 2)]
[im 1/30]
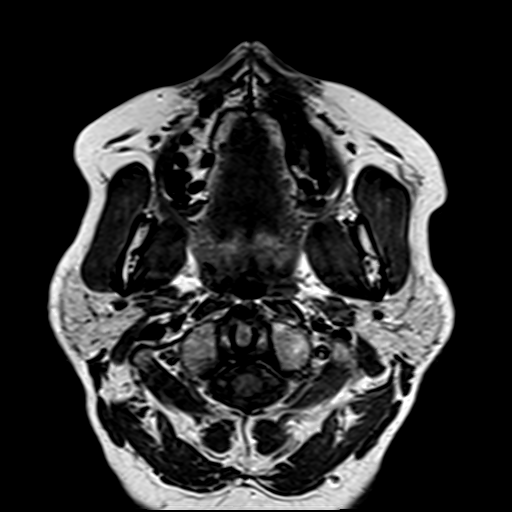
[im 10/30]
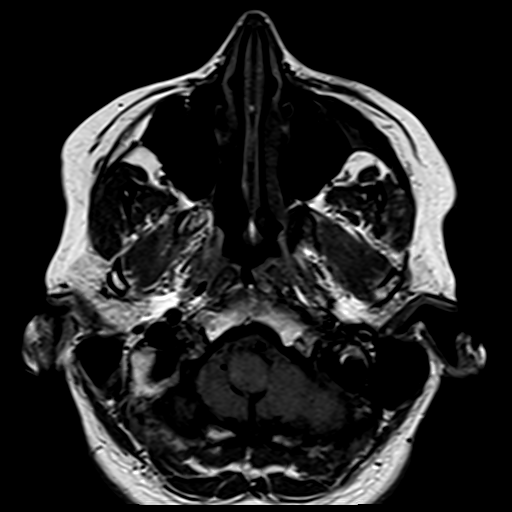
[im 20/30]
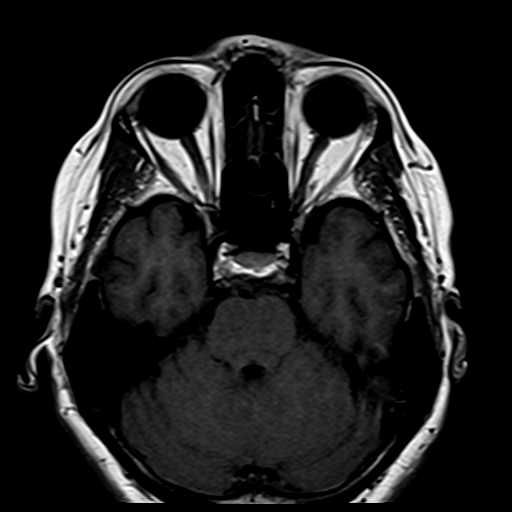
[im 30/30]
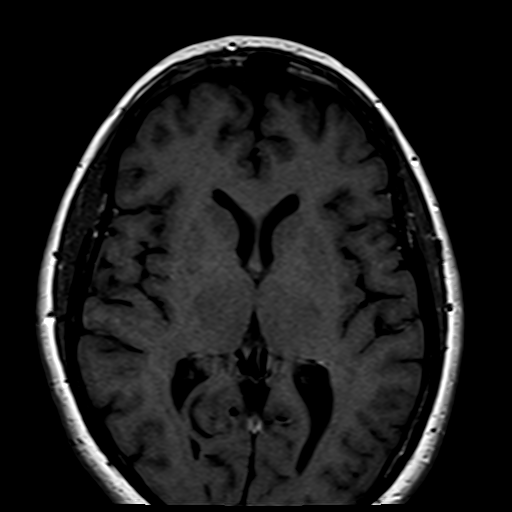

[Series 12: T2 fat-sat · axial · 3.0mm · 0.35mm/px · z∈[-52,+34]mm · 4 of 30 slices shown (2 of 2)]
[im 1/30]
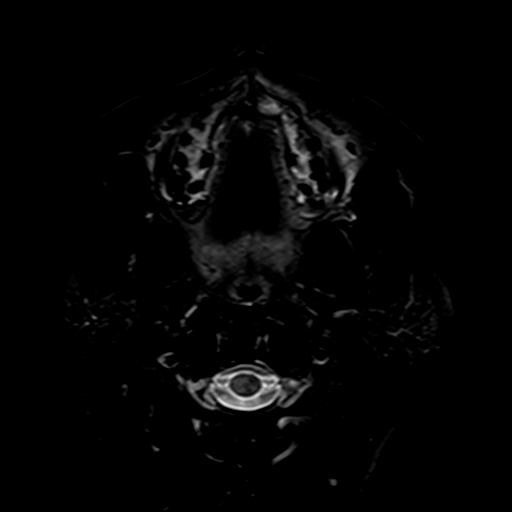
[im 10/30]
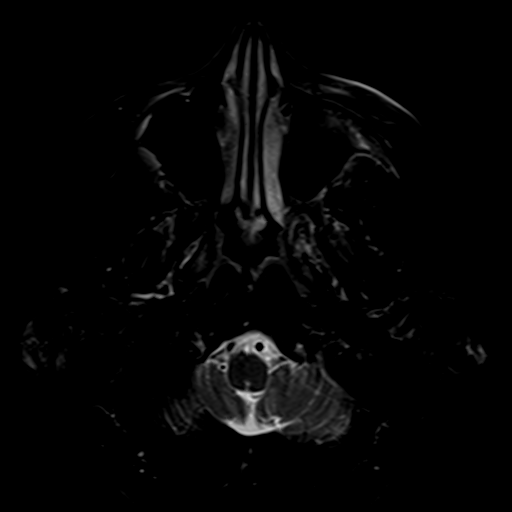
[im 20/30]
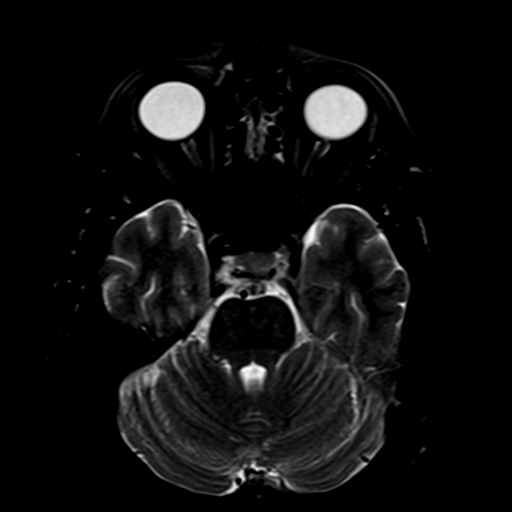
[im 30/30]
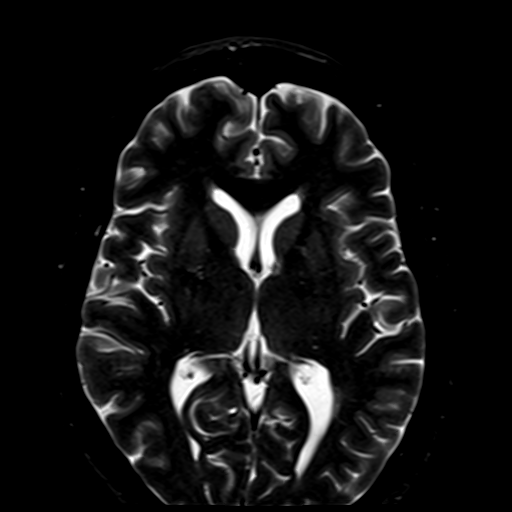

[Series 13: T1 fat-sat · coronal · 3.0mm · 0.35mm/px · 2 of 24 slices shown]
[im 1/24]
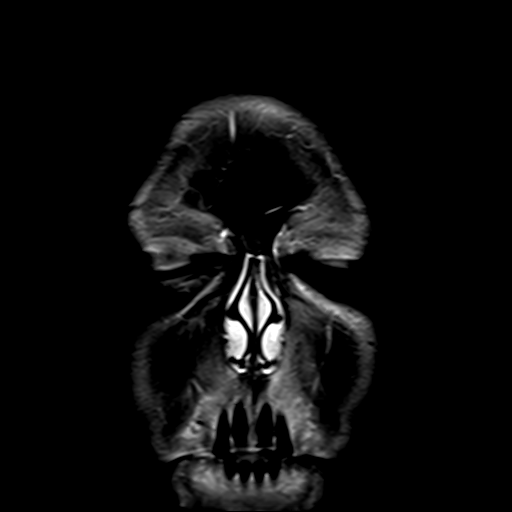
[im 12/24]
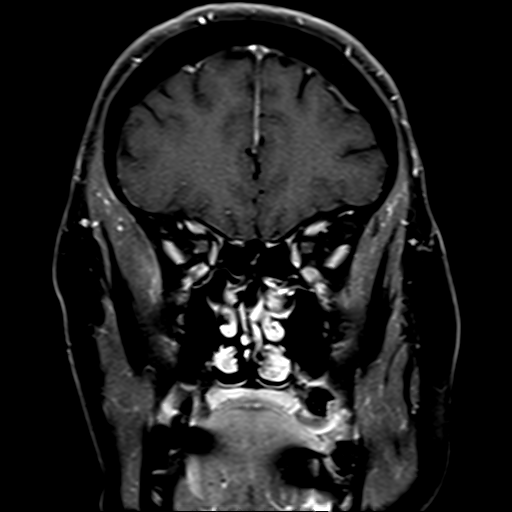

[19 of 48 positions shown; findings below may reference images not displayed]

FINDINGS: Routine imaging of the brain is negative except for minor white
matter disease.

Globes are symmetric.  Extra-axial muscles are intact.

There is an enhancing mass, in the roof of the LEFT maxillary sinus,
corresponding to the CT abnormality, estimated measurements of 21 x
9 x 9 mm. (R-L x A-P x C-C). There appears to be bony infiltration
in the region of the zygomaticomaxillary junction region, based on
pre and postcontrast images. No definite orbital extension. The
enlargement of the infraorbital nerve canal corresponds to a portion
of the mass which protrudes downward into the sinus. The bony
infiltration is atypical for a benign schwannoma. The findings are
more likely to represent bony destruction by a malignancy, such as
lymphoma, squamous cell carcinoma, or other neoplasm. Aggressive
granulomatous disease could have this appearance. Tissue sampling is
warranted.
IMPRESSION: Roughly 2 x 1 x 1 cm mass in the roof of the LEFT maxillary sinus,
with regional osseous extension. Possible perineural tumor spread to
the infraorbital nerve. Tissue sampling is warranted to exclude an
aggressive granulomatous or neoplastic process.

These results will be called to the ordering clinician or
representative by the Radiologist Assistant, and communication
documented in the PACS or zVision Dashboard.

## 2017-10-27 DIAGNOSIS — M79672 Pain in left foot: Secondary | ICD-10-CM | POA: Diagnosis not present

## 2017-10-27 DIAGNOSIS — Z0000 Encounter for general adult medical examination without abnormal findings: Secondary | ICD-10-CM | POA: Diagnosis not present

## 2017-10-27 DIAGNOSIS — M79671 Pain in right foot: Secondary | ICD-10-CM | POA: Diagnosis not present

## 2017-10-27 DIAGNOSIS — C8209 Follicular lymphoma grade I, extranodal and solid organ sites: Secondary | ICD-10-CM | POA: Diagnosis not present

## 2017-10-27 DIAGNOSIS — E785 Hyperlipidemia, unspecified: Secondary | ICD-10-CM | POA: Diagnosis not present

## 2017-11-08 ENCOUNTER — Encounter: Payer: Medicare Other | Admitting: Internal Medicine

## 2017-11-09 ENCOUNTER — Other Ambulatory Visit: Payer: Self-pay

## 2017-12-06 DIAGNOSIS — M2021 Hallux rigidus, right foot: Secondary | ICD-10-CM | POA: Diagnosis not present

## 2017-12-06 DIAGNOSIS — Q6622 Congenital metatarsus adductus: Secondary | ICD-10-CM | POA: Diagnosis not present

## 2017-12-06 DIAGNOSIS — Q667 Congenital pes cavus: Secondary | ICD-10-CM | POA: Diagnosis not present

## 2017-12-06 DIAGNOSIS — M79671 Pain in right foot: Secondary | ICD-10-CM | POA: Diagnosis not present

## 2017-12-06 DIAGNOSIS — M19071 Primary osteoarthritis, right ankle and foot: Secondary | ICD-10-CM | POA: Diagnosis not present

## 2017-12-06 DIAGNOSIS — M205X2 Other deformities of toe(s) (acquired), left foot: Secondary | ICD-10-CM | POA: Diagnosis not present

## 2017-12-06 DIAGNOSIS — M1A0790 Idiopathic chronic gout, unspecified ankle and foot, without tophus (tophi): Secondary | ICD-10-CM | POA: Diagnosis not present

## 2017-12-06 DIAGNOSIS — M79672 Pain in left foot: Secondary | ICD-10-CM | POA: Diagnosis not present

## 2017-12-06 DIAGNOSIS — M19072 Primary osteoarthritis, left ankle and foot: Secondary | ICD-10-CM | POA: Diagnosis not present

## 2017-12-19 ENCOUNTER — Ambulatory Visit (AMBULATORY_SURGERY_CENTER): Payer: Self-pay

## 2017-12-19 VITALS — Ht 66.0 in | Wt 179.8 lb

## 2017-12-19 DIAGNOSIS — Z86010 Personal history of colonic polyps: Secondary | ICD-10-CM

## 2017-12-19 NOTE — Progress Notes (Signed)
No egg or soy allergy known to patient  Nausea and vomiting with past sedation with any surgeries  or procedures, no intubation problems. No problems with propofol  No diet pills per patient No home 02 use per patient  No blood thinners per patient  Pt denies issues with constipation  No A fib or A flutter  EMMI video sent to pt's e mail. Pt declined

## 2017-12-20 DIAGNOSIS — C8209 Follicular lymphoma grade I, extranodal and solid organ sites: Secondary | ICD-10-CM | POA: Diagnosis not present

## 2018-01-02 ENCOUNTER — Ambulatory Visit (AMBULATORY_SURGERY_CENTER): Payer: Medicare Other | Admitting: Internal Medicine

## 2018-01-02 ENCOUNTER — Encounter: Payer: Self-pay | Admitting: Internal Medicine

## 2018-01-02 ENCOUNTER — Other Ambulatory Visit: Payer: Self-pay

## 2018-01-02 VITALS — BP 118/74 | HR 63 | Temp 97.8°F | Resp 13 | Ht 66.0 in | Wt 179.0 lb

## 2018-01-02 DIAGNOSIS — D122 Benign neoplasm of ascending colon: Secondary | ICD-10-CM | POA: Diagnosis not present

## 2018-01-02 DIAGNOSIS — Z86010 Personal history of colonic polyps: Secondary | ICD-10-CM | POA: Diagnosis not present

## 2018-01-02 DIAGNOSIS — D120 Benign neoplasm of cecum: Secondary | ICD-10-CM | POA: Diagnosis not present

## 2018-01-02 DIAGNOSIS — Z1211 Encounter for screening for malignant neoplasm of colon: Secondary | ICD-10-CM | POA: Diagnosis not present

## 2018-01-02 MED ORDER — LUBIPROSTONE 8 MCG PO CAPS: 8.0000 ug | ORAL_CAPSULE | Freq: Two times a day (BID) | ORAL | 3 refills | Status: DC

## 2018-01-02 MED ORDER — SODIUM CHLORIDE 0.9 % IV SOLN: 500.0000 mL | Freq: Once | INTRAVENOUS | Status: DC

## 2018-01-02 NOTE — Progress Notes (Signed)
To PACU, VSS. Report to [email or web address] 

## 2018-01-02 NOTE — Progress Notes (Signed)
Called to room to assist during endoscopic procedure.  Patient ID and intended procedure confirmed with present staff. Received instructions for my participation in the procedure from the performing physician.  

## 2018-01-02 NOTE — Op Note (Signed)
[facility] Patient Name: Jessica Chavez Procedure Date: 01/02/2018 10:38 AM MRN: [id number] Endoscopist: Jerene Bears , MD Age: 67 Referring MD:  Date of Birth: [birth year] Gender: Female Account #: [id number] Procedure:                Colonoscopy Indications:              High risk colon cancer surveillance: Personal                            history of adenoma (10 mm or greater in size), Last                            colonoscopy 3 years ago Medicines:                Monitored Anesthesia Care Procedure:                Pre-Anesthesia Assessment:                           - Prior to the procedure, a History and Physical                            was performed, and patient medications and                            allergies were reviewed. The patient's tolerance of                            previous anesthesia was also reviewed. The risks                            and benefits of the procedure and the sedation                            options and risks were discussed with the patient.                            All questions were answered, and informed consent                            was obtained. Prior Anticoagulants: The patient has                            taken no previous anticoagulant or antiplatelet                            agents. ASA Grade Assessment: II - A patient with                            mild systemic disease. After reviewing the risks                            and benefits, the patient was deemed in  satisfactory condition to undergo the procedure.                           After obtaining informed consent, the colonoscope                            was passed under direct vision. Throughout the                            procedure, the patient's blood pressure, pulse, and                            oxygen saturations were monitored continuously. The                            Model PCF-[id number] scope  was introduced                            through the anus and advanced to the cecum,                            identified by appendiceal orifice and ileocecal                            valve. The colonoscopy was performed without                            difficulty. The patient tolerated the procedure                            well. The quality of the bowel preparation was                            good. The ileocecal valve, appendiceal orifice, and                            rectum were photographed. Scope In: 10:46:51 AM Scope Out: 11:01:11 AM Scope Withdrawal Time: 0 hours 10 minutes 46 seconds  Total Procedure Duration: 0 hours 14 minutes 20 seconds  Findings:                 The digital rectal exam was normal.                           A 6 mm polyp was found in the cecum. The polyp was                            sessile. The polyp was removed with a cold snare.                            Resection and retrieval were complete.                           Two sessile polyps were found in the ascending  colon. The polyps were 3 to 5 mm in size. These                            polyps were removed with a cold snare. Resection                            and retrieval were complete.                           Multiple small and large-mouthed diverticula were                            found in the sigmoid colon and descending colon.                           External hemorrhoids were found during                            retroflexion. The hemorrhoids were small. Complications:            No immediate complications. Estimated Blood Loss:     Estimated blood loss was minimal. Impression:               - One 6 mm polyp in the cecum, removed with a cold                            snare. Resected and retrieved.                           - Two 3 to 5 mm polyps in the ascending colon,                            removed with a cold snare. Resected and retrieved.                            - Moderate diverticulosis in the sigmoid colon and                            in the descending colon.                           - Small external hemorrhoids. Recommendation:           - Patient has a contact number available for                            emergencies. The signs and symptoms of potential                            delayed complications were discussed with the                            patient. Return to normal activities tomorrow.  Written discharge instructions were provided to the                            patient.                           - Resume previous diet.                           - Continue present medications.                           - Await pathology results.                           - Repeat colonoscopy is recommended for                            surveillance. The colonoscopy date will be                            determined after pathology results from today's                            exam become available for review. Jerene Bears, MD 01/02/2018 11:05:37 AM This report has been signed electronically.

## 2018-01-02 NOTE — Progress Notes (Signed)
Pt's states no medical or surgical changes since previsit or office visit. 

## 2018-01-02 NOTE — Patient Instructions (Signed)
YOU HAD AN ENDOSCOPIC PROCEDURE TODAY AT THE [facility]:   Refer to the procedure report that was given to you for any specific questions about what was found during the examination.  If the procedure report does not answer your questions, please call your gastroenterologist to clarify.  If you requested that your care partner not be given the details of your procedure findings, then the procedure report has been included in a sealed envelope for you to review at your convenience later.  YOU SHOULD EXPECT: Some feelings of bloating in the abdomen. Passage of more gas than usual.  Walking can help get rid of the air that was put into your GI tract during the procedure and reduce the bloating. If you had a lower endoscopy (such as a colonoscopy or flexible sigmoidoscopy) you may notice spotting of blood in your stool or on the toilet paper. If you underwent a bowel prep for your procedure, you may not have a normal bowel movement for a few days.  Please Note:  You might notice some irritation and congestion in your nose or some drainage.  This is from the oxygen used during your procedure.  There is no need for concern and it should clear up in a day or so.  SYMPTOMS TO REPORT IMMEDIATELY:   Following lower endoscopy (colonoscopy or flexible sigmoidoscopy):  Excessive amounts of blood in the stool  Significant tenderness or worsening of abdominal pains  Swelling of the abdomen that is new, acute  Fever of 100F or higher  For urgent or emergent issues, a gastroenterologist can be reached at any hour by calling [phone].   DIET:  We do recommend a small meal at first, but then you may proceed to your regular diet.  Drink plenty of fluids but you should avoid alcoholic beverages for 24 hours.  ACTIVITY:  You should plan to take it easy for the rest of today and you should NOT DRIVE or use heavy machinery until tomorrow (because of the sedation medicines used during the test).     FOLLOW UP: Our staff will call the number listed on your records the next business day following your procedure to check on you and address any questions or concerns that you may have regarding the information given to you following your procedure. If we do not reach you, we will leave a message.  However, if you are feeling well and you are not experiencing any problems, there is no need to return our call.  We will assume that you have returned to your regular daily activities without incident.  If any biopsies were taken you will be contacted by phone or by letter within the next 1-3 weeks.  Please call us at [phone] if you have not heard about the biopsies in 3 weeks.   Await for biopsy results Polyps (handout given) Diverticulosis (handout given) Hemorrhoids (handout given)   SIGNATURES/CONFIDENTIALITY: You and/or your care partner have signed paperwork which will be entered into your electronic medical record.  These signatures attest to the fact that that the information above on your After Visit Summary has been reviewed and is understood.  Full responsibility of the confidentiality of this discharge information lies with you and/or your care-partner. 

## 2018-01-03 ENCOUNTER — Telehealth: Payer: Self-pay

## 2018-01-03 NOTE — Telephone Encounter (Signed)
  Follow up Call-  Call back number 01/02/2018  Post procedure Call Back phone  # [phone]  Permission to leave phone message Yes  Some recent data might be hidden     Patient questions:  Do you have a fever, pain , or abdominal swelling? No. Pain Score  0 *  Have you tolerated food without any problems? Yes.    Have you been able to return to your normal activities? Yes.    Do you have any questions about your discharge instructions: Diet   No. Medications  No. Follow up visit  No.  Do you have questions or concerns about your Care? No.  Actions: * If pain score is 4 or above: No action needed, pain <4.

## 2018-01-16 ENCOUNTER — Encounter: Payer: Self-pay | Admitting: Internal Medicine

## 2018-01-20 DIAGNOSIS — Z23 Encounter for immunization: Secondary | ICD-10-CM | POA: Diagnosis not present

## 2018-02-18 ENCOUNTER — Other Ambulatory Visit: Payer: Self-pay | Admitting: Internal Medicine

## 2018-02-20 ENCOUNTER — Other Ambulatory Visit: Payer: Self-pay | Admitting: Internal Medicine

## 2018-02-20 NOTE — Telephone Encounter (Signed)
===  View-only below this line===  ----- Message ----- From: Jerene Bears, MD Sent: 02/20/2018  10:48 AM EST To: Larina Bras, CMA  I spoke to pathology and called her back Thanks for checking on this JMP

## 2018-02-23 DIAGNOSIS — M3501 Sicca syndrome with keratoconjunctivitis: Secondary | ICD-10-CM | POA: Diagnosis not present

## 2018-03-24 DIAGNOSIS — C8209 Follicular lymphoma grade I, extranodal and solid organ sites: Secondary | ICD-10-CM | POA: Diagnosis not present

## 2018-03-28 ENCOUNTER — Ambulatory Visit: Payer: Medicare Other | Admitting: Podiatry

## 2018-03-29 ENCOUNTER — Ambulatory Visit (INDEPENDENT_AMBULATORY_CARE_PROVIDER_SITE_OTHER): Payer: Medicare Other

## 2018-03-29 ENCOUNTER — Encounter: Payer: Self-pay | Admitting: Podiatry

## 2018-03-29 ENCOUNTER — Ambulatory Visit (INDEPENDENT_AMBULATORY_CARE_PROVIDER_SITE_OTHER): Payer: Medicare Other | Admitting: Podiatry

## 2018-03-29 DIAGNOSIS — M7752 Other enthesopathy of left foot: Secondary | ICD-10-CM | POA: Diagnosis not present

## 2018-03-29 DIAGNOSIS — M19079 Primary osteoarthritis, unspecified ankle and foot: Secondary | ICD-10-CM

## 2018-03-29 DIAGNOSIS — M2020 Hallux rigidus, unspecified foot: Secondary | ICD-10-CM

## 2018-03-29 DIAGNOSIS — M7750 Other enthesopathy of unspecified foot: Secondary | ICD-10-CM

## 2018-03-29 DIAGNOSIS — M7751 Other enthesopathy of right foot: Secondary | ICD-10-CM

## 2018-03-29 DIAGNOSIS — M205X9 Other deformities of toe(s) (acquired), unspecified foot: Secondary | ICD-10-CM

## 2018-03-31 NOTE — Progress Notes (Signed)
   HPI: 67 year old female presenting today as a new patient with a chief complaint of pain to the bilateral great toes that began about 9 months ago. She reports associated pain to the arch and dorsum of the left foot. Walking and bearing weight for long periods of time increases the pain. She has not had any treatment recently. Patient is here for further evaluation and treatment.   Past Medical History:  Diagnosis Date  . Anal fissure   . Cataract    bilateral  . Chicken pox   . Colon polyps   . Constipation   . Diverticulitis   . Endometrial cancer ([place])   . Gallstones   . GERD (gastroesophageal reflux disease)   . Heartburn   . Hiatal hernia   . Hot flashes   . Hyperlipidemia   . Osteoarthritis   . PONV (postoperative nausea and vomiting)    pt gets very nauseated, usually takes propofol for sedation  . Status post dilation of esophageal narrowing   . Uterine cancer [facility])      Physical Exam: General: The patient is alert and oriented x3 in no acute distress.  Dermatology: Skin is warm, dry and supple bilateral lower extremities. Negative for open lesions or macerations.  Vascular: Palpable pedal pulses bilaterally. No edema or erythema noted. Capillary refill within normal limits.  Neurological: Epicritic and protective threshold grossly intact bilaterally.   Musculoskeletal Exam: Pain on palpation with limited range of motion noted to the first MPJ of bilateral feet.   Radiographic Exam: Degenerative changes noted with joint space narrowing first MPJ. There also appears to be extra-articular spurring noted about the joint.    Assessment: 1. DJD/hallux limitus bilateral, right greater than left   Plan of Care:  1. Patient evaluated. X-Rays reviewed.  2. Discussed different treatment options, both conservative and surgical.  3. Patient wants to pursue conservative treatment at this time.  4. Appointment with Liliane Channel, Pedorthist, for custom molded orthotics with  Morton's extension. 5. Handicap placard completed.  6. Return to clinic in 8 weeks.      Edrick Kins, DPM [facility]  Dr. Edrick Kins, DPM    [address], [place] [address]                Office [phone]  Fax [phone]

## 2018-04-06 DIAGNOSIS — J018 Other acute sinusitis: Secondary | ICD-10-CM | POA: Diagnosis not present

## 2018-04-17 ENCOUNTER — Ambulatory Visit: Payer: Medicare Other | Admitting: Orthotics

## 2018-04-17 DIAGNOSIS — M2020 Hallux rigidus, unspecified foot: Secondary | ICD-10-CM

## 2018-04-17 DIAGNOSIS — M205X9 Other deformities of toe(s) (acquired), unspecified foot: Secondary | ICD-10-CM

## 2018-04-17 DIAGNOSIS — M19079 Primary osteoarthritis, unspecified ankle and foot: Secondary | ICD-10-CM

## 2018-04-17 NOTE — Progress Notes (Signed)
Patient is going to try a carbon plate morton's extension that I carved out of standard plate.  I also added 3* valgus ff wedge to see if that helped as well.  She has a future appointment w me.

## 2018-04-25 ENCOUNTER — Ambulatory Visit (INDEPENDENT_AMBULATORY_CARE_PROVIDER_SITE_OTHER): Payer: Self-pay | Admitting: Orthotics

## 2018-04-25 DIAGNOSIS — M205X9 Other deformities of toe(s) (acquired), unspecified foot: Secondary | ICD-10-CM

## 2018-04-25 DIAGNOSIS — M2022 Hallux rigidus, left foot: Secondary | ICD-10-CM

## 2018-04-25 DIAGNOSIS — M2020 Hallux rigidus, unspecified foot: Secondary | ICD-10-CM

## 2018-04-25 DIAGNOSIS — M2021 Hallux rigidus, right foot: Secondary | ICD-10-CM

## 2018-04-25 NOTE — Progress Notes (Signed)
Patient came into today to be cast for Custom Foot Orthotics. Upon recommendation of Dr. Amalia Hailey Patient presents with FHL R greater L Goals are mobilizing 1st MPJ R w/ morton's extension (I previously gave her ME made out of carbon foot plate.  She said it worked well in [place], but not L) Medical illustrator

## 2018-05-17 ENCOUNTER — Ambulatory Visit (INDEPENDENT_AMBULATORY_CARE_PROVIDER_SITE_OTHER): Payer: Medicare Other | Admitting: Orthotics

## 2018-05-17 DIAGNOSIS — M2020 Hallux rigidus, unspecified foot: Principal | ICD-10-CM

## 2018-05-17 DIAGNOSIS — M19079 Primary osteoarthritis, unspecified ankle and foot: Secondary | ICD-10-CM

## 2018-05-17 DIAGNOSIS — M205X9 Other deformities of toe(s) (acquired), unspecified foot: Secondary | ICD-10-CM

## 2018-05-17 NOTE — Progress Notes (Signed)
Patient came in today to pick up custom made foot orthotics.  The goals were accomplished and the patient reported no dissatisfaction with said orthotics.  Patient was advised of breakin period and how to report any issues. 

## 2018-05-30 ENCOUNTER — Ambulatory Visit (INDEPENDENT_AMBULATORY_CARE_PROVIDER_SITE_OTHER): Payer: Medicare Other | Admitting: Podiatry

## 2018-05-30 ENCOUNTER — Encounter: Payer: Self-pay | Admitting: Podiatry

## 2018-05-30 DIAGNOSIS — M19079 Primary osteoarthritis, unspecified ankle and foot: Secondary | ICD-10-CM

## 2018-05-30 DIAGNOSIS — M205X9 Other deformities of toe(s) (acquired), unspecified foot: Secondary | ICD-10-CM

## 2018-05-30 DIAGNOSIS — M2020 Hallux rigidus, unspecified foot: Secondary | ICD-10-CM

## 2018-06-01 NOTE — Progress Notes (Signed)
   HPI: 68 year old female presenting today for follow up evaluation of hallux limitus bilaterally. She states she is doing well and her pain has improved overall. She reports some pain to the dorsal left foot caused by the new orthotics. Patient is here for further evaluation and treatment.   Past Medical History:  Diagnosis Date  . Anal fissure   . Cataract    bilateral  . Chicken pox   . Colon polyps   . Constipation   . Diverticulitis   . Endometrial cancer ([place])   . Gallstones   . GERD (gastroesophageal reflux disease)   . Heartburn   . Hiatal hernia   . Hot flashes   . Hyperlipidemia   . Osteoarthritis   . PONV (postoperative nausea and vomiting)    pt gets very nauseated, usually takes propofol for sedation  . Status post dilation of esophageal narrowing   . Uterine cancer [facility])      Physical Exam: General: The patient is alert and oriented x3 in no acute distress.  Dermatology: Skin is warm, dry and supple bilateral lower extremities. Negative for open lesions or macerations.  Vascular: Palpable pedal pulses bilaterally. No edema or erythema noted. Capillary refill within normal limits.  Neurological: Epicritic and protective threshold intact. Paresthesia with burning, shooting pain noted to the left dorsal foot.  Musculoskeletal Exam: Pain on palpation with limited range of motion noted to the first MPJ of bilateral feet.   Assessment: 1. DJD/hallux limitus bilateral, right greater than left 2. Neuritis left dorsal foot   Plan of Care:  1. Patient evaluated.  2. Appointment with Liliane Channel, Pedorthist for custom orthotics modification.  3. Continue taking Gabapentin as prescribed by oral surgeon.  4. Recommended good shoe gear.  5. Return to clinic as needed.      Edrick Kins, DPM [facility]  Dr. Edrick Kins, DPM    [address], [place] [address]                Office [phone]  Fax  [phone]

## 2018-06-21 ENCOUNTER — Other Ambulatory Visit: Payer: Self-pay

## 2018-06-21 ENCOUNTER — Ambulatory Visit: Payer: Medicare Other | Admitting: Orthotics

## 2018-06-21 DIAGNOSIS — M205X9 Other deformities of toe(s) (acquired), unspecified foot: Secondary | ICD-10-CM

## 2018-06-21 DIAGNOSIS — M2020 Hallux rigidus, unspecified foot: Principal | ICD-10-CM

## 2018-06-21 NOTE — Progress Notes (Signed)
Needs arch lowered LEFT only

## 2018-07-12 ENCOUNTER — Other Ambulatory Visit: Payer: Medicare Other | Admitting: Orthotics

## 2018-08-02 ENCOUNTER — Encounter: Payer: Self-pay | Admitting: Internal Medicine

## 2018-08-17 ENCOUNTER — Other Ambulatory Visit: Payer: Self-pay | Admitting: Internal Medicine

## 2018-08-18 ENCOUNTER — Other Ambulatory Visit: Payer: Self-pay

## 2018-08-18 ENCOUNTER — Other Ambulatory Visit: Payer: Self-pay | Admitting: Internal Medicine

## 2018-08-18 MED ORDER — OMEPRAZOLE 20 MG PO CPDR: DELAYED_RELEASE_CAPSULE | ORAL | 1 refills | Status: DC

## 2018-08-18 NOTE — Telephone Encounter (Signed)
Rx for omeprazole sent to [facility].

## 2018-08-30 ENCOUNTER — Encounter: Payer: Self-pay | Admitting: *Deleted

## 2018-08-31 ENCOUNTER — Ambulatory Visit (INDEPENDENT_AMBULATORY_CARE_PROVIDER_SITE_OTHER): Payer: Medicare Other | Admitting: Internal Medicine

## 2018-08-31 ENCOUNTER — Encounter: Payer: Self-pay | Admitting: Internal Medicine

## 2018-08-31 ENCOUNTER — Other Ambulatory Visit: Payer: Self-pay

## 2018-08-31 VITALS — Ht 66.0 in | Wt 179.0 lb

## 2018-08-31 DIAGNOSIS — K219 Gastro-esophageal reflux disease without esophagitis: Secondary | ICD-10-CM

## 2018-08-31 DIAGNOSIS — Z860100 Personal history of colon polyps, unspecified: Secondary | ICD-10-CM

## 2018-08-31 DIAGNOSIS — K5904 Chronic idiopathic constipation: Secondary | ICD-10-CM

## 2018-08-31 MED ORDER — LUBIPROSTONE 8 MCG PO CAPS: ORAL_CAPSULE | ORAL | 2 refills | Status: DC

## 2018-08-31 NOTE — Patient Instructions (Addendum)
We have sent the following medications to your pharmacy for you to pick up at your convenience: Amitiza 8 mcg twice daily  Continue omeprazole 20 mg daily  You will be due for a recall colonoscopy in 12/2020. We will send you a reminder in the mail when it gets closer to that time.  Please follow up with Dr Hilarie Fredrickson in the office in 1 year.

## 2018-08-31 NOTE — Progress Notes (Signed)
   Subjective:    Patient ID: Jessica Chavez, female    DOB: 09-20-1950, 68 y.o.   MRN: [id number]  This service was provided via telemedicine.  Telephone encounter The patient was located at home The provider was located in provider's GI office. The patient did consent to this telephone visit and is aware of possible charges through their insurance for this visit.   The persons participating in this telemedicine service were the patient and I. 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  Seen by telephone visit today in the setting of COVID-19.  She was last seen in the office at the time of her surveillance colonoscopy performed 01/02/2018 and for an office visit on 08/08/2017.  Her colonoscopy revealed 3 polyps, 1 cecum, 2 ascending colon measuring 3 to 6 mm.  These were removed found to be tubular adenomas.  There was left colon diverticulosis and small hemorrhoids.  She reports she has been doing well.  She continues the Amitiza 8 mcg twice daily.  With this she is having 1 bowel movement daily.  Constipation is well treated with this medicine.  No rectal bleeding or melena.  No abdominal pain.  She is still taking omeprazole 20 mg a day and reports without this she develops recurrent heartburn.  She is tried going without the medicine and learns that she needs it on a daily basis.  Has not had recurrent dysphagia and did receive upper endoscopy with dilation several years ago.  Her esophagus was dilated in 2016 with a [place].   Review of Systems As per HPI, otherwise negative  Current Medications, Allergies, Past Medical History, Past Surgical History, Family History and Social History were  reviewed in [employer] record.     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   1.  Chronic idiopathic constipation --symptoms well controlled and stable on Amitiza.  We will continue Amitiza 8 mcg twice daily --Refill Amitiza 8 mcg twice daily  2.  GERD --well-controlled when on PPI.  Off of PPI symptoms recur and are troublesome.  Continue omeprazole 20 mg daily --Refill omeprazole 20 mg daily  3.  History of adenomatous colon polyps --surveillance colonoscopy last year, repeat colonoscopy recommended for surveillance in September 2022  4.  Follicular lymphoma maxillary sinus --treated with radiation and following at [facility] follow-up, sooner if needed

## 2018-08-31 NOTE — Addendum Note (Signed)
Addended by: Larina Bras on: 08/31/2018 03:54 PM   Modules accepted: Orders

## 2018-09-12 ENCOUNTER — Ambulatory Visit (INDEPENDENT_AMBULATORY_CARE_PROVIDER_SITE_OTHER): Payer: Medicare Other | Admitting: Podiatry

## 2018-09-12 ENCOUNTER — Encounter: Payer: Self-pay | Admitting: Podiatry

## 2018-09-12 ENCOUNTER — Other Ambulatory Visit: Payer: Self-pay

## 2018-09-12 VITALS — Temp 97.1°F

## 2018-09-12 DIAGNOSIS — M19072 Primary osteoarthritis, left ankle and foot: Secondary | ICD-10-CM | POA: Diagnosis not present

## 2018-09-12 DIAGNOSIS — M19079 Primary osteoarthritis, unspecified ankle and foot: Secondary | ICD-10-CM

## 2018-09-12 DIAGNOSIS — L989 Disorder of the skin and subcutaneous tissue, unspecified: Secondary | ICD-10-CM

## 2018-09-14 NOTE — Progress Notes (Signed)
   Subjective: 68 year old female presenting today with a chief complaint of a painful lesion on the plantar aspect of the right foot that appeared about one month ago. Walking increases the pain. She has been soaking the foot in vinegar and water but that causes burning of the feet. Patient is here for further evaluation and treatment.   Past Medical History:  Diagnosis Date  . Anal fissure   . Cataract    bilateral  . Chicken pox   . Colon polyps   . Constipation   . Diverticulitis   . Endometrial cancer ([place])   . Gallstones   . GERD (gastroesophageal reflux disease)   . Heartburn   . Hiatal hernia   . Hot flashes   . Hyperlipidemia   . Osteoarthritis   . PONV (postoperative nausea and vomiting)    pt gets very nauseated, usually takes propofol for sedation  . Status post dilation of esophageal narrowing   . Uterine cancer ([place])      Objective:  Physical Exam General: Alert and oriented x3 in no acute distress  Dermatology: Hyperkeratotic lesion present on the right forefoot. Pain on palpation with a central nucleated core noted. Skin is warm, dry and supple bilateral lower extremities. Negative for open lesions or macerations.  Vascular: Palpable pedal pulses bilaterally. No edema or erythema noted. Capillary refill within normal limits.  Neurological: Epicritic and protective threshold grossly intact bilaterally.   Musculoskeletal Exam: Pain on palpation at the keratotic lesion noted. Pain with palpation noted to the left midfoot. Range of motion within normal limits bilateral. Muscle strength 5/5 in all groups bilateral.  Assessment: 1. DJD/capsulitis left midfoot 2. Porokeratosis right forefoot    Plan of Care:  1. Patient evaluated 2. Excisional debridement of keratoic lesion using a chisel blade was performed without incident. Salinocaine applied.  3. Dressed area with light dressing. 4. Injection of 0.5 mLs Celestone Soluspan injected into the left midfoot.   5. Continue wearing Vionic shoes.  6. Patient is to return to the clinic PRN.   Edrick Kins, DPM [facility]  Dr. Edrick Kins, [address]                                        [place], [place] [address]                Office [phone]  Fax [phone]

## 2018-12-21 ENCOUNTER — Telehealth: Payer: Self-pay | Admitting: Podiatry

## 2018-12-21 NOTE — Telephone Encounter (Signed)
[facility] PATIENT- Pt called and wanted to know if she could update handicap form, she would like it past 6 months.

## 2019-01-03 ENCOUNTER — Other Ambulatory Visit: Payer: Self-pay | Admitting: Internal Medicine

## 2019-02-02 ENCOUNTER — Other Ambulatory Visit: Payer: Self-pay | Admitting: Internal Medicine

## 2019-04-26 ENCOUNTER — Other Ambulatory Visit: Payer: Self-pay | Admitting: Internal Medicine

## 2019-05-24 ENCOUNTER — Ambulatory Visit: Payer: Medicare Other

## 2019-06-19 ENCOUNTER — Encounter: Payer: Self-pay | Admitting: Gastroenterology

## 2019-06-19 ENCOUNTER — Ambulatory Visit (INDEPENDENT_AMBULATORY_CARE_PROVIDER_SITE_OTHER): Payer: Medicare Other | Admitting: Gastroenterology

## 2019-06-19 VITALS — BP 114/70 | HR 64 | Temp 97.4°F | Ht 65.25 in | Wt 182.0 lb

## 2019-06-19 DIAGNOSIS — K5909 Other constipation: Secondary | ICD-10-CM

## 2019-06-19 NOTE — Progress Notes (Signed)
06/19/2019 [place] [id number] 14-Jun-1950   HISTORY OF PRESENT ILLNESS: This is a pleasant 69 year old female who is a patient of Dr. Vena Rua.  She follows here for colonoscopies and issues with chronic constipation.  She is up-to-date with colonoscopy, due in September 2022.  She is here today to discuss her bowel regimen.  She has been on Amitiza 8 mcg twice daily for several years.  Now her insurance, Medicare, will not pay for it.  She says that they will cover Linzess, but it will still be very expensive for her, about $200 for 32-month supply.  She is asking about any other over-the-counter options.  She says that her constipation really seemed to worsen around the time of her husband's death 5 years ago.  She was wondering if it was more stress-induced and is wondering if she could try to taper off of the Amitiza and see how she does without any type of regimen now that it has been some time and her stress levels have reduced, etc.   Past Medical History:  Diagnosis Date  . Anal fissure   . Cataract    bilateral  . Chicken pox   . Colon polyps   . Constipation   . Diverticulitis   . Endometrial cancer ([place])   . Gallstones   . GERD (gastroesophageal reflux disease)   . Heartburn   . Hiatal hernia   . Hot flashes   . Hyperlipidemia   . Osteoarthritis   . PONV (postoperative nausea and vomiting)    pt gets very nauseated, usually takes propofol for sedation  . Status post dilation of esophageal narrowing   . Uterine cancer [facility])    Past Surgical History:  Procedure Laterality Date  . APPENDECTOMY  2000  . BREAST BIOPSY Right   . BREAST CYST ASPIRATION Left   . CHOLECYSTECTOMY  1984  . Cornea Laser Surgery Bilateral   . HEMORROIDECTOMY  1973  . MASS EXCISION N/A 12/22/2016   Procedure: BIOPSY OF MASS;  Surgeon: Izora Gala, MD;  Location: [place] [facility];  Service: ENT;  Laterality: N/A;  . MAXILLARY ANTROSTOMY N/A 12/22/2016   Procedure: LEFT  MAXILLARY ANTROSTOMY REMOVAL TISSUE BIOPSY OF MASS;  Surgeon: Izora Gala, MD;  Location: [facility];  Service: ENT;  Laterality: N/A;  . NASAL SINUS SURGERY    . POLYPECTOMY    . REPLACEMENT TOTAL KNEE Left 2012  . REPLACEMENT TOTAL KNEE Right 2013  . TOTAL ABDOMINAL HYSTERECTOMY  2000  . UPPER GASTROINTESTINAL ENDOSCOPY      reports that she has never smoked. She has never used smokeless tobacco. She reports that she does not drink alcohol or use drugs. family history includes Breast cancer in her mother; COPD in her mother; Cataracts in her mother; Colon polyps in her mother; Endometriosis in her mother; Glaucoma in her mother; Heart disease in her father, maternal grandfather, and maternal grandmother; Hyperlipidemia in her father and mother; Hypertension in her father; Lymphoma in her mother; Osteoarthritis in her mother. Allergies  Allergen Reactions  . Hydrocodone Nausea And Vomiting  . Penicillins Rash      Outpatient Encounter Medications as of 06/19/2019  Medication Sig  . Calcium Carbonate-Vit D-Min (CALCIUM 1200 PO) Take 1 capsule by mouth daily.   . Carboxymethylcellulose Sodium (REFRESH TEARS OP) Apply to eye as needed.   . Cholecalciferol (VITAMIN D-1000 MAX ST) 1000 units tablet Take 1 tablet by mouth every morning.  . Cyanocobalamin (B-12 PO) Take  2 tablets by mouth daily.   . DENTA 5000 PLUS 1.1 % CREA dental cream Place 1 application onto teeth every morning.  . fluticasone (FLONASE) 50 MCG/ACT nasal spray Place into the nose daily.   Marland Kitchen lubiprostone (AMITIZA) 8 MCG capsule TAKE 1 CAPSULE TWICE DAILY WITH MEALS. NEEDS OFFICE VISIT FOR FURTHER REFILLS  . Multiple Vitamin (MULTIVITAMIN) tablet Take 1 tablet by mouth daily.  . Olopatadine HCl (PATADAY) 0.2 % SOLN Apply to eye.  Marland Kitchen omeprazole (PRILOSEC) 20 MG capsule TAKE 1 CAPSULE DAILY  . Oxymetazoline HCl (NASAL SPRAY) 0.05 % SOLN Place into the nose as needed.   . pravastatin (PRAVACHOL) 20 MG tablet Take  1 tablet (20 mg total) by mouth daily. At night   No facility-administered encounter medications on file as of 06/19/2019.     REVIEW OF SYSTEMS  : All other systems reviewed and negative except where noted in the History of Present Illness.   PHYSICAL EXAM: BP 114/70   Pulse 64   Temp (!) 97.4 F (36.3 C)   Ht 5' 5.25" (1.657 m)   Wt 182 lb (82.6 kg)   BMI 30.05 kg/m  General: Well developed white female in no acute distress Head: Normocephalic and atraumatic Eyes:  Sclerae anicteric, conjunctiva pink. Ears: Normal auditory acuity Lungs: Clear throughout to auscultation; no increased WOB. Heart: Regular rate and rhythm; no increased WOB. Abdomen: Soft, non-distended.  BS present.  Non-tender. Musculoskeletal: Symmetrical with no gross deformities  Skin: No lesions on visible extremities Extremities: No edema  Neurological: Alert oriented x 4, grossly non-focal Psychological:  Alert and cooperative. Normal mood and affect  ASSESSMENT AND PLAN: *Chronic constipation: Had been doing very well on Amitiza 8 mcg twice daily for several years.  Now insurance will not pay for it.  They will cover Linzess, but will still be very expensive for her.  We discussed MiraLAX once or twice daily as an option.  She says that her constipation worsened around the time that her husband passed away and she thinks that it was somewhat stress-induced.  She is wondering if she could try to taper off the Amitiza and see how she does.  She is going to decrease down to once daily and then maybe to every other day and then if she does well will come off of it altogether.  If she finds that she cannot go without some type of regimen then she will start MiraLAX once or twice daily.   CC:  Barbaraann Boys, MD

## 2019-06-19 NOTE — Patient Instructions (Signed)
Ok to taper off Amitiza, then if need something else go to Miralax once or twice daily  If you are age 71 or older, your body mass index should be between 23-30. Your Body mass index is 30.05 kg/m. If this is out of the aforementioned range listed, please consider follow up with your Primary Care Provider.  If you are age 8 or younger, your body mass index should be between 19-25. Your Body mass index is 30.05 kg/m. If this is out of the aformentioned range listed, please consider follow up with your Primary Care Provider.    I appreciate the  opportunity to care for you  Thank You   Janett Billow Zehr,PA-C

## 2019-06-27 NOTE — Progress Notes (Signed)
Addendum: Reviewed and agree with assessment and management plan. Lysander Calixte M, MD  

## 2019-07-31 ENCOUNTER — Other Ambulatory Visit: Payer: Self-pay | Admitting: Internal Medicine

## 2019-10-29 ENCOUNTER — Other Ambulatory Visit: Payer: Self-pay | Admitting: Internal Medicine

## 2020-04-21 ENCOUNTER — Other Ambulatory Visit: Payer: Self-pay | Admitting: Internal Medicine

## 2020-10-30 ENCOUNTER — Telehealth: Payer: Self-pay | Admitting: Internal Medicine

## 2020-10-30 MED ORDER — OMEPRAZOLE 20 MG PO CPDR: 20.0000 mg | DELAYED_RELEASE_CAPSULE | Freq: Every day | ORAL | 0 refills | Status: DC

## 2020-10-30 NOTE — Telephone Encounter (Signed)
Inbound call from patient requesting a refill for omeprazole.

## 2020-10-30 NOTE — Telephone Encounter (Signed)
Rx sent 

## 2020-12-02 ENCOUNTER — Encounter: Payer: Self-pay | Admitting: Gastroenterology

## 2020-12-02 ENCOUNTER — Ambulatory Visit (INDEPENDENT_AMBULATORY_CARE_PROVIDER_SITE_OTHER): Payer: Medicare Other | Admitting: Gastroenterology

## 2020-12-02 VITALS — BP 138/70 | HR 76 | Ht 66.0 in | Wt 178.1 lb

## 2020-12-02 DIAGNOSIS — K219 Gastro-esophageal reflux disease without esophagitis: Secondary | ICD-10-CM

## 2020-12-02 DIAGNOSIS — R1319 Other dysphagia: Secondary | ICD-10-CM | POA: Diagnosis not present

## 2020-12-02 DIAGNOSIS — Z860100 Personal history of colon polyps, unspecified: Secondary | ICD-10-CM | POA: Insufficient documentation

## 2020-12-02 DIAGNOSIS — K5909 Other constipation: Secondary | ICD-10-CM

## 2020-12-02 MED ORDER — OMEPRAZOLE 20 MG PO CPDR: 20.0000 mg | DELAYED_RELEASE_CAPSULE | Freq: Two times a day (BID) | ORAL | 3 refills | Status: DC

## 2020-12-02 MED ORDER — LINACLOTIDE 72 MCG PO CAPS: 72.0000 ug | ORAL_CAPSULE | Freq: Every day | ORAL | 1 refills | Status: DC

## 2020-12-02 MED ORDER — PLENVU 140 G PO SOLR: 1.0000 | ORAL | 0 refills | Status: DC

## 2020-12-02 NOTE — Patient Instructions (Addendum)
It was my pleasure to provide care to you today. Based on our discussion, I am providing you with my recommendations below:  RECOMMENDATION(S):   PRESCRIPTION MEDICATION(S):   We have sent the following medication(s) to your pharmacy:  Omeprazole Linzess - we are providing you with samples today  NOTE: If your medication(s) requires a PRIOR AUTHORIZATION, we will receive notification from your pharmacy. Once received, the process to submit for approval may take up to 7-10 business days. You will be contacted about any denials we have received from your insurance company as well as alternatives recommended by your provider.  COLONOSCOPY AND ENDOSCOPY:   You have been scheduled for an endoscopy and a colonoscopy. Please follow the written instructions given to you at your visit today.  PREP:   Please pick up your prep supplies at the pharmacy within the next 1-3 days.  INHALERS:   If you use inhalers (even only as needed), please bring them with you on the day of your procedure.  COLONOSCOPY TIPS:  To reduce nausea and dehydration, stay well hydrated for 3-4 days prior to the exam.  To prevent skin/hemorrhoid irritation - prior to wiping, put A&Dointment or vaseline on the toilet paper. Keep a towel or pad on the bed.  BEFORE STARTING YOUR PREP, drink  64oz of clear liquids in the morning. This will help to flush the colon and will ensure you are well hydrated!!!!  NOTE - This is in addition to the fluids required for to complete your prep. Use of a flavored hard candy, such as grape Anise Salvo, can counteract some of the flavor of the prep and may prevent some nausea.    FOLLOW UP:  After your procedure, you will receive a call from my office staff regarding my recommendation for follow up.  BMI:  If you are age 53 or older, your body mass index should be between 23-30. Your There is no height or weight on file to calculate BMI. If this is out of the aforementioned range  listed, please consider follow up with your Primary Care Provider.  MY CHART:  The  GI providers would like to encourage you to use [facility] to communicate with providers for non-urgent requests or questions.  Due to long hold times on the telephone, sending your provider a message by [facility] may be a faster and more efficient way to get a response.  Please allow 48 business hours for a response.  Please remember that this is for non-urgent requests.   Thank you for trusting me with your gastrointestinal care!    Alonza Bogus PA-C

## 2020-12-02 NOTE — Progress Notes (Signed)
12/02/2020 [place] [id number] Aug 10, 1950   HISTORY OF PRESENT ILLNESS: This is a 70 year old female who is a patient of Dr. Vena Rua.  She is here today to schedule her repeat colonoscopy.  Last was in 12/2017.  She has history of colon polyps, with polyp greater than 10 mm previously.  Repeat was recommended a 3-year interval.  She suffers from chronic constipation.  She says that previously she was on Amitiza 8 mcg twice daily and that worked well for her, but insurance stopped paying for it and was very expensive.  She is currently using stool softeners and Citrucel capsules daily, but is not 100% satisfied with that.  She cannot do MiraLAX or anything powder/liquid because she cannot tolerate the taste.  Denies rectal bleeding.  While she is here she also reports some issues with swallowing.  She says that this occurs about a couple of times per week.  It can happen with solid foods, but also sometimes it is water.  She says that it would feel like it gets stuck for a brief period of time and then will eventually go down.  She is able to continue eating after that happens.  She says that she had some issues with swallowing that were similar in the past and had EGD with dilation in 2016.  It appears that EGD did not show any stricturing, but was empirically dilated.  She says that seemed to help until just recently when these issues started again over the past several months.  She is on omeprazole 20 mg daily, but tells me that she does not feel like her acid reflux symptoms are as well-controlled as it had been in the past.  Past Medical History:  Diagnosis Date   Anal fissure    Cataract    bilateral   Chicken pox    Colon polyps    Constipation    Diverticulitis    Endometrial cancer (HCC)    Gallstones    GERD (gastroesophageal reflux disease)    Heartburn    Hiatal hernia    Hot flashes    Hyperlipidemia    Osteoarthritis    PONV (postoperative nausea and vomiting)     pt gets very nauseated, usually takes propofol for sedation   Status post dilation of esophageal narrowing    Uterine cancer ([place])    Past Surgical History:  Procedure Laterality Date   APPENDECTOMY  2000   BREAST BIOPSY Right    BREAST CYST ASPIRATION Left    CHOLECYSTECTOMY  1984   Cornea Laser Surgery Bilateral    HEMORROIDECTOMY  1973   MASS EXCISION N/A 12/22/2016   Procedure: BIOPSY OF MASS;  Surgeon: Izora Gala, MD;  Location: [place];  Service: ENT;  Laterality: N/A;   MAXILLARY ANTROSTOMY N/A 12/22/2016   Procedure: LEFT MAXILLARY ANTROSTOMY REMOVAL TISSUE BIOPSY OF MASS;  Surgeon: Izora Gala, MD;  Location: [place] [place];  Service: ENT;  Laterality: N/A;   NASAL SINUS SURGERY     POLYPECTOMY     REPLACEMENT TOTAL KNEE Left 2012   REPLACEMENT TOTAL KNEE Right 2013   TOTAL ABDOMINAL HYSTERECTOMY  2000   UPPER GASTROINTESTINAL ENDOSCOPY      reports that she has never smoked. She has never used smokeless tobacco. She reports that she does not drink alcohol and does not use drugs. family history includes Breast cancer in her mother; COPD in her mother; Cataracts in her mother; Colon polyps in her mother;  Endometriosis in her mother; Glaucoma in her mother; Heart disease in her father, maternal grandfather, and maternal grandmother; Hyperlipidemia in her father and mother; Hypertension in her father; Lymphoma in her mother; Osteoarthritis in her mother. Allergies  Allergen Reactions   Hydrocodone Nausea And Vomiting   Penicillins Rash      Outpatient Encounter Medications as of 12/02/2020  Medication Sig   Calcium Carbonate-Vit D-Min (CALCIUM 1200 PO) Take 1 capsule by mouth daily.    carboxymethylcellulose (REFRESH PLUS) 0.5 % SOLN Apply 1 drop to eye daily.   Carboxymethylcellulose Sodium (REFRESH TEARS OP) Apply to eye as needed.    Cholecalciferol 25 MCG (1000 UT) tablet Take 1 tablet by mouth every morning.   Cyanocobalamin (B-12 PO) Take  2 tablets by mouth daily.    DENTAGEL 1.1 % GEL dental gel Take 1 application by mouth at bedtime.   DHA-EPA-Flaxseed Oil-Vitamin E (THERA TEARS NUTRITION PO) Place 1,200 mg into both eyes.   fluticasone (FLONASE) 50 MCG/ACT nasal spray Place into the nose daily.    Methylcellulose, Laxative, (CITRUCEL PO) Take 1 mg by mouth daily.   Multiple Vitamin (MULTIVITAMIN) tablet Take 1 tablet by mouth daily.   omeprazole (PRILOSEC) 20 MG capsule Take 1 capsule (20 mg total) by mouth daily.   pravastatin (PRAVACHOL) 20 MG tablet Take 1 tablet (20 mg total) by mouth daily. At night   Probiotic Product (PROBIOTIC-10 PO) Take 10 mg by mouth daily.   Turmeric (QC TUMERIC COMPLEX PO) Take 400 mg by mouth daily.   [DISCONTINUED] DENTA 5000 PLUS 1.1 % CREA dental cream Place 1 application onto teeth every morning.   [DISCONTINUED] lubiprostone (AMITIZA) 8 MCG capsule TAKE 1 CAPSULE TWICE DAILY WITH MEALS. NEEDS OFFICE VISIT FOR FURTHER REFILLS   [DISCONTINUED] Olopatadine HCl (PATADAY) 0.2 % SOLN Apply to eye.   [DISCONTINUED] Oxymetazoline HCl (NASAL SPRAY) 0.05 % SOLN Place into the nose as needed.    No facility-administered encounter medications on file as of 12/02/2020.     REVIEW OF SYSTEMS  : All other systems reviewed and negative except where noted in the History of Present Illness.   PHYSICAL EXAM: BP 138/70   Pulse 76   Ht '5\' 6"'$  (1.676 m)   Wt 178 lb 2 oz (80.8 kg)   BMI 28.75 kg/m  General: Well developed white female in no acute distress Head: Normocephalic and atraumatic Eyes:  Sclerae anicteric, conjunctiva pink. Ears: Normal auditory acuity Lungs: Clear throughout to auscultation; no W/R/R. Heart: Regular rate and rhythm; no M/R/G. Abdomen: Soft, non-distended.  BS present.  Minimal left sided TTP. Rectal:  Will be done at the time of colonoscopy. Musculoskeletal: Symmetrical with no gross deformities  Skin: No lesions on visible extremities Extremities: No edema   Neurological: Alert oriented x 4, grossly non-focal Psychological:  Alert and cooperative. Normal mood and affect  ASSESSMENT AND PLAN: *Chronic constipation: Did well with Amitiza 8 mcg twice daily previously, but insurance will no longer cover that.  She is currently doing stool softeners and Citrucel fiber capsules.  Not 100% satisfied with that.  Not sure what her insurance will cover, but we will try Linzess 72 mcg daily.  Prescription was sent to her pharmacy and she was given samples. *Personal history of colon polyps: Last colonoscopy September 2019 with a recommended repeat in 3 years.  We will schedule Dr. Hilarie Fredrickson. *Dysphagia and GERD: Feels like once daily omeprazole 20 mg is not helping her symptoms as well as it used to.  We  will try to increase that to twice daily dosing.  New prescription sent to pharmacy.  She has intermittent dysphagia, usually a couple of times per week.  She says that dilation back in 2016 helped when she had these issues previously.  Sounds like could be more of an esophageal dysmotility rather than a fixed structural issue.  Nonetheless we will proceed with EGD with possible repeat dilation with Dr. Hilarie Fredrickson at the time of her colonoscopy.  **The risks, benefits, and alternatives to EGD with dilation and colonoscopy were discussed with the patient and she consents to proceed.    CC:  Barbaraann Boys, MD

## 2020-12-02 NOTE — Progress Notes (Signed)
Addendum: Reviewed and agree with assessment and management plan. Layni Kreamer M, MD  

## 2020-12-08 DIAGNOSIS — R1319 Other dysphagia: Secondary | ICD-10-CM

## 2020-12-08 DIAGNOSIS — Z86010 Personal history of colonic polyps: Secondary | ICD-10-CM

## 2020-12-08 DIAGNOSIS — K5909 Other constipation: Secondary | ICD-10-CM

## 2020-12-08 DIAGNOSIS — K219 Gastro-esophageal reflux disease without esophagitis: Secondary | ICD-10-CM

## 2020-12-08 MED ORDER — OMEPRAZOLE 20 MG PO CPDR: 20.0000 mg | DELAYED_RELEASE_CAPSULE | Freq: Every day | ORAL | 3 refills | Status: DC

## 2020-12-08 MED ORDER — LINACLOTIDE 72 MCG PO CAPS: 72.0000 ug | ORAL_CAPSULE | Freq: Every day | ORAL | 1 refills | Status: DC

## 2021-02-02 ENCOUNTER — Other Ambulatory Visit: Payer: Self-pay | Admitting: Internal Medicine

## 2021-02-05 ENCOUNTER — Ambulatory Visit: Payer: Medicare Other | Admitting: Internal Medicine

## 2021-02-13 ENCOUNTER — Other Ambulatory Visit: Payer: Self-pay

## 2021-02-13 ENCOUNTER — Encounter: Payer: Self-pay | Admitting: Internal Medicine

## 2021-02-13 ENCOUNTER — Ambulatory Visit (AMBULATORY_SURGERY_CENTER): Payer: Medicare Other | Admitting: Internal Medicine

## 2021-02-13 VITALS — BP 149/82 | HR 65 | Temp 97.1°F | Resp 10 | Ht 66.0 in | Wt 178.0 lb

## 2021-02-13 DIAGNOSIS — K222 Esophageal obstruction: Secondary | ICD-10-CM | POA: Diagnosis not present

## 2021-02-13 DIAGNOSIS — Z860100 Personal history of colon polyps, unspecified: Secondary | ICD-10-CM

## 2021-02-13 DIAGNOSIS — R1319 Other dysphagia: Secondary | ICD-10-CM

## 2021-02-13 DIAGNOSIS — K5909 Other constipation: Secondary | ICD-10-CM

## 2021-02-13 DIAGNOSIS — D123 Benign neoplasm of transverse colon: Secondary | ICD-10-CM | POA: Diagnosis not present

## 2021-02-13 DIAGNOSIS — K219 Gastro-esophageal reflux disease without esophagitis: Secondary | ICD-10-CM | POA: Diagnosis not present

## 2021-02-13 DIAGNOSIS — D120 Benign neoplasm of cecum: Secondary | ICD-10-CM | POA: Diagnosis not present

## 2021-02-13 DIAGNOSIS — K449 Diaphragmatic hernia without obstruction or gangrene: Secondary | ICD-10-CM | POA: Diagnosis not present

## 2021-02-13 DIAGNOSIS — K317 Polyp of stomach and duodenum: Secondary | ICD-10-CM

## 2021-02-13 MED ORDER — LINACLOTIDE 72 MCG PO CAPS: 72.0000 ug | ORAL_CAPSULE | Freq: Every day | ORAL | 11 refills | Status: DC

## 2021-02-13 MED ORDER — SODIUM CHLORIDE 0.9 % IV SOLN: 500.0000 mL | Freq: Once | INTRAVENOUS | Status: AC

## 2021-02-13 MED ORDER — OMEPRAZOLE 40 MG PO CPDR
40.0000 mg | DELAYED_RELEASE_CAPSULE | Freq: Every day | ORAL | 11 refills | Status: DC
Start: 2021-02-13 — End: 2021-05-01

## 2021-02-13 NOTE — Progress Notes (Signed)
To PACU, VSS. Report to [email or web address] 

## 2021-02-13 NOTE — Op Note (Signed)
[place] Patient Name: Jessica Chavez Procedure Date: 02/13/2021 10:51 AM MRN: [id number] Endoscopist: Jerene Bears , MD Age: 70 Referring MD:  Date of Birth: 10-Dec-1950 Gender: Female Account #: [id number] Procedure:                Upper GI endoscopy Indications:              Dysphagia, Gastro-esophageal reflux disease,                            previous favorable response to [facility] dilation in                            2016 Medicines:                Monitored Anesthesia Care Procedure:                Pre-Anesthesia Assessment:                           - Prior to the procedure, a History and Physical                            was performed, and patient medications and                            allergies were reviewed. The patient's tolerance of                            previous anesthesia was also reviewed. The risks                            and benefits of the procedure and the sedation                            options and risks were discussed with the patient.                            All questions were answered, and informed consent                            was obtained. Prior Anticoagulants: The patient has                            taken no previous anticoagulant or antiplatelet                            agents. ASA Grade Assessment: II - A patient with                            mild systemic disease. After reviewing the risks                            and benefits, the patient was deemed in  satisfactory condition to undergo the procedure.                           After obtaining informed consent, the endoscope was                            passed under direct vision. Throughout the                            procedure, the patient's blood pressure, pulse, and                            oxygen saturations were monitored continuously. The                            Endoscope was introduced through the mouth, and                             advanced to the second part of duodenum. The upper                            GI endoscopy was accomplished without difficulty.                            The patient tolerated the procedure well. Scope In: Scope Out: Findings:                 One benign-appearing, intrinsic mild                            (non-circumferential scarring) stenosis was found                            38 cm from the incisors. This stenosis measured 1.2                            cm (inner diameter) x 1 cm (in length). The                            stenosis was traversed. The scope was withdrawn.                            Dilation was performed with a Maloney dilator with                            mild resistance at 52 Fr.                           A 1-2 cm hiatal hernia was present.                           The gastroesophageal flap valve was visualized                            endoscopically and  classified as Hill Grade III                            (minimal fold, loose to endoscope, hiatal hernia                            likely).                           A few diminutive sessile polyps were found in the                            gastric fundus and in the gastric body. These have                            the typical benign appearance of fundic gland                            polyps.                           The exam of the stomach was otherwise normal.                           The examined duodenum was normal. Complications:            No immediate complications. Estimated Blood Loss:     Estimated blood loss: none. Impression:               - Benign-appearing esophageal stenosis. Dilated                            with 52 Fr Maloney.                           - 1-2 cm hiatal hernia.                           - A few gastric polyps. Benign.                           - Normal examined duodenum.                           - No specimens collected. Recommendation:            - Patient has a contact number available for                            emergencies. The signs and symptoms of potential                            delayed complications were discussed with the                            patient. Return to normal activities tomorrow.  Written discharge instructions were provided to the                            patient.                           - Resume previous diet.                           - Continue present medications.                           - Increase omeprazole to 40 mg once daily (best 30                            min before 1st meal of the day)                           - If heartburn/GERD or trouble swallowing persists                            please contact me for followup visit. Jerene Bears, MD 02/13/2021 11:43:46 AM This report has been signed electronically.

## 2021-02-13 NOTE — Op Note (Signed)
[place] Patient Name: Jessica Chavez Procedure Date: 02/13/2021 10:50 AM MRN: [id number] Endoscopist: Jerene Bears , MD Age: 70 Referring MD:  Date of Birth: 04-17-1950 Gender: Female Account #: [id number] Procedure:                Colonoscopy Indications:              High risk colon cancer surveillance: Personal                            history of multiple adenomas (removed over time                            including 1 adenoma > 1 cm removed prior to 2019),                            Last colonoscopy: September 2019 Medicines:                Monitored Anesthesia Care Procedure:                Pre-Anesthesia Assessment:                           - Prior to the procedure, a History and Physical                            was performed, and patient medications and                            allergies were reviewed. The patient's tolerance of                            previous anesthesia was also reviewed. The risks                            and benefits of the procedure and the sedation                            options and risks were discussed with the patient.                            All questions were answered, and informed consent                            was obtained. Prior Anticoagulants: The patient has                            taken no previous anticoagulant or antiplatelet                            agents. ASA Grade Assessment: II - A patient with                            mild systemic disease. After reviewing the risks  and benefits, the patient was deemed in                            satisfactory condition to undergo the procedure.                           After obtaining informed consent, the colonoscope                            was passed under direct vision. Throughout the                            procedure, the patient's blood pressure, pulse, and                            oxygen saturations were monitored  continuously. The                            Olympus PCF-H190DL ([id number]) Colonoscope was                            introduced through the anus and advanced to the                            cecum, identified by appendiceal orifice and                            ileocecal valve. The colonoscopy was performed                            without difficulty. The patient tolerated the                            procedure well. The quality of the bowel                            preparation was good. The ileocecal valve,                            appendiceal orifice, and rectum were photographed. Scope In: 11:17:09 AM Scope Out: 11:36:05 AM Scope Withdrawal Time: 0 hours 14 minutes 45 seconds  Total Procedure Duration: 0 hours 18 minutes 56 seconds  Findings:                 The digital rectal exam was normal.                           A 5 mm polyp was found in the cecum. The polyp was                            sessile. The polyp was removed with a cold snare.                            Resection and retrieval were complete.  A 4 mm polyp was found in the proximal transverse                            colon. The polyp was sessile. The polyp was removed                            with a cold snare. Resection and retrieval were                            complete.                           Multiple small and large-mouthed diverticula were                            found in the sigmoid colon and descending colon.                           Internal hemorrhoids were found during                            retroflexion. The hemorrhoids were small. Complications:            No immediate complications. Estimated Blood Loss:     Estimated blood loss was minimal. Impression:               - One 5 mm polyp in the cecum, removed with a cold                            snare. Resected and retrieved.                           - One 4 mm polyp in the proximal transverse colon,                             removed with a cold snare. Resected and retrieved.                           - Diverticulosis in the sigmoid colon and in the                            descending colon.                           - Internal hemorrhoids. Recommendation:           - Patient has a contact number available for                            emergencies. The signs and symptoms of potential                            delayed complications were discussed with the  patient. Return to normal activities tomorrow.                            Written discharge instructions were provided to the                            patient.                           - Resume previous diet.                           - Continue present medications.                           - Await pathology results.                           - Repeat colonoscopy is recommended for                            surveillance. The colonoscopy date will be                            determined after pathology results from today's                            exam become available for review. Jerene Bears, MD 02/13/2021 11:46:13 AM This report has been signed electronically.

## 2021-02-13 NOTE — Patient Instructions (Addendum)
Upper-Resume previous diet and present medications. Increase Omeprazole to 40 mg once daily. Take 30 minutes before 1st meal of the day. If heartburn persists please contact me for a follow up visit. Lower-Awaiting pathology results. Repeat Colonoscopy for surveillance likely in 5 years based on pathology results.  YOU HAD AN ENDOSCOPIC PROCEDURE TODAY AT [facility]:   Refer to the procedure report that was given to you for any specific questions about what was found during the examination.  If the procedure report does not answer your questions, please call your gastroenterologist to clarify.  If you requested that your care partner not be given the details of your procedure findings, then the procedure report has been included in a sealed envelope for you to review at your convenience later.  YOU SHOULD EXPECT: Some feelings of bloating in the abdomen. Passage of more gas than usual.  Walking can help get rid of the air that was put into your GI tract during the procedure and reduce the bloating. If you had a lower endoscopy (such as a colonoscopy or flexible sigmoidoscopy) you may notice spotting of blood in your stool or on the toilet paper. If you underwent a bowel prep for your procedure, you may not have a normal bowel movement for a few days.  Please Note:  You might notice some irritation and congestion in your nose or some drainage.  This is from the oxygen used during your procedure.  There is no need for concern and it should clear up in a day or so.  SYMPTOMS TO REPORT IMMEDIATELY:  Following lower endoscopy (colonoscopy or flexible sigmoidoscopy):  Excessive amounts of blood in the stool  Significant tenderness or worsening of abdominal pains  Swelling of the abdomen that is new, acute  Fever of 100F or higher  Following upper endoscopy (EGD)  Vomiting of blood or coffee ground material  New chest pain or pain under the shoulder blades  Painful or persistently  difficult swallowing  New shortness of breath  Fever of 100F or higher  Black, tarry-looking stools  For urgent or emergent issues, a gastroenterologist can be reached at any hour by calling [phone]. Do not use MyChart messaging for urgent concerns.    DIET:  We do recommend a small meal at first, but then you may proceed to your regular diet.  Drink plenty of fluids but you should avoid alcoholic beverages for 24 hours.  ACTIVITY:  You should plan to take it easy for the rest of today and you should NOT DRIVE or use heavy machinery until tomorrow (because of the sedation medicines used during the test).    FOLLOW UP: Our staff will call the number listed on your records 48-72 hours following your procedure to check on you and address any questions or concerns that you may have regarding the information given to you following your procedure. If we do not reach you, we will leave a message.  We will attempt to reach you two times.  During this call, we will ask if you have developed any symptoms of COVID 19. If you develop any symptoms (ie: fever, flu-like symptoms, shortness of breath, cough etc.) before then, please call [phone].  If you test positive for Covid 19 in the 2 weeks post procedure, please call and report this information to [place].    If any biopsies were taken you will be contacted by phone or by letter within the next 1-3 weeks.  Please call us at [phone] if you have not heard about the biopsies in 3 weeks.    SIGNATURES/CONFIDENTIALITY: You and/or your care partner have signed paperwork which will be entered into your electronic medical record.  These signatures attest to the fact that that the information above on your After Visit Summary has been reviewed and is understood.  Full responsibility of the confidentiality of this discharge information lies with you and/or your care-partner.

## 2021-02-13 NOTE — Progress Notes (Signed)
Called to room to assist during endoscopic procedure.  Patient ID and intended procedure confirmed with present staff. Received instructions for my participation in the procedure from the performing physician.  

## 2021-02-13 NOTE — Progress Notes (Signed)
History reviewed 

## 2021-02-13 NOTE — Progress Notes (Signed)
Patient ID: Jessica Chavez, female   DOB: [birth year], 70 y.o.   MRN: [id number]    GASTROENTEROLOGY PROCEDURE H&P NOTE   Primary Care Physician: Barbaraann Boys, MD    Reason for Procedure:  GERD with dysphagia, personal history of colon polyps  Plan:    Upper endoscopy with probable dilation and colonoscopy  Patient is appropriate for endoscopic procedure(s) in the ambulatory ([place]) setting.  The nature of the procedure, as well as the risks, benefits, and alternatives were carefully and thoroughly reviewed with the patient. Ample time for discussion and questions allowed. The patient understood, was satisfied, and agreed to proceed.     HPI: Jessica Chavez is a 70 y.o. female who presents for upper endoscopy and surveillance colonoscopy.  Medical history as below.  Seen in the office by Alonza Bogus, PA-C on 12/02/2020.  No significant changes in medical history since that time.  She did have an upper endoscopy at an outside GI practice in 2016.  During that time her esophagus was dilated with a Senath.  Today she denies recent chest pain, shortness of breath.  No abdominal pain.  Tolerated the prep.  Past Medical History:  Diagnosis Date   Anal fissure    Cataract    bilateral   Chicken pox    Colon polyps    Constipation    Diverticulitis    Endometrial cancer (HCC)    Gallstones    GERD (gastroesophageal reflux disease)    Heartburn    Hiatal hernia    Hot flashes    Hyperlipidemia    Osteoarthritis    PONV (postoperative nausea and vomiting)    pt gets very nauseated, usually takes propofol for sedation   Status post dilation of esophageal narrowing    Uterine cancer ([place])     Past Surgical History:  Procedure Laterality Date   APPENDECTOMY  2000   BREAST BIOPSY Right    BREAST CYST ASPIRATION Left    CHOLECYSTECTOMY  1984   Cornea Laser Surgery Bilateral    HEMORROIDECTOMY  1973   MASS EXCISION N/A 12/22/2016   Procedure: BIOPSY OF MASS;   Surgeon: Izora Gala, MD;  Location: [facility];  Service: ENT;  Laterality: N/A;   MAXILLARY ANTROSTOMY N/A 12/22/2016   Procedure: LEFT MAXILLARY ANTROSTOMY REMOVAL TISSUE BIOPSY OF MASS;  Surgeon: Izora Gala, MD;  Location: [facility];  Service: ENT;  Laterality: N/A;   NASAL SINUS SURGERY     POLYPECTOMY     REPLACEMENT TOTAL KNEE Left 2012   REPLACEMENT TOTAL KNEE Right 2013   TOTAL ABDOMINAL HYSTERECTOMY  2000   UPPER GASTROINTESTINAL ENDOSCOPY      Prior to Admission medications   Medication Sig Start Date End Date Taking? Authorizing Provider  Calcium Carbonate-Vit D-Min (CALCIUM 1200 PO) Take 1 capsule by mouth daily.    Yes [provider]  Carboxymethylcellulose Sodium (REFRESH TEARS OP) Apply to eye as needed.    Yes [provider]  Cholecalciferol 25 MCG (1000 UT) tablet Take 1 tablet by mouth every morning.   Yes [provider]  Cyanocobalamin (B-12 PO) Take 2 tablets by mouth daily.    Yes [provider]  DENTAGEL 1.1 % GEL dental gel Take 1 application by mouth at bedtime. 11/07/20  Yes [provider]  linaclotide Rolan Lipa) 72 MCG capsule Take 1 capsule (72 mcg total) by mouth daily before breakfast. 12/08/20  Yes Zehr, Laban Emperor, PA-C  Multiple Vitamin (MULTIVITAMIN)  tablet Take 1 tablet by mouth daily.   Yes [provider]  omeprazole (PRILOSEC) 20 MG capsule Take 1 capsule (20 mg total) by mouth daily. 12/08/20  Yes Zehr, Laban Emperor, PA-C  pravastatin (PRAVACHOL) 20 MG tablet Take 1 tablet (20 mg total) by mouth daily. At night 06/14/17  Yes McLean-Scocuzza, Nino Glow, MD  Turmeric (QC TUMERIC COMPLEX PO) Take 400 mg by mouth daily.   Yes [provider]  carboxymethylcellulose (REFRESH PLUS) 0.5 % SOLN Apply 1 drop to eye daily.    [provider]  DHA-EPA-Flaxseed Oil-Vitamin E (THERA TEARS NUTRITION PO) Place 1,200 mg into both eyes.    [provider]   fluticasone (FLONASE) 50 MCG/ACT nasal spray Place into the nose daily.     [provider]  Methylcellulose, Laxative, (CITRUCEL PO) Take 1 mg by mouth daily.    [provider]    Current Outpatient Medications  Medication Sig Dispense Refill   Calcium Carbonate-Vit D-Min (CALCIUM 1200 PO) Take 1 capsule by mouth daily.      Carboxymethylcellulose Sodium (REFRESH TEARS OP) Apply to eye as needed.      Cholecalciferol 25 MCG (1000 UT) tablet Take 1 tablet by mouth every morning.     Cyanocobalamin (B-12 PO) Take 2 tablets by mouth daily.      DENTAGEL 1.1 % GEL dental gel Take 1 application by mouth at bedtime.     linaclotide (LINZESS) 72 MCG capsule Take 1 capsule (72 mcg total) by mouth daily before breakfast. 30 capsule 1   Multiple Vitamin (MULTIVITAMIN) tablet Take 1 tablet by mouth daily.     omeprazole (PRILOSEC) 20 MG capsule Take 1 capsule (20 mg total) by mouth daily. 90 capsule 3   pravastatin (PRAVACHOL) 20 MG tablet Take 1 tablet (20 mg total) by mouth daily. At night 90 tablet 0   Turmeric (QC TUMERIC COMPLEX PO) Take 400 mg by mouth daily.     carboxymethylcellulose (REFRESH PLUS) 0.5 % SOLN Apply 1 drop to eye daily.     DHA-EPA-Flaxseed Oil-Vitamin E (THERA TEARS NUTRITION PO) Place 1,200 mg into both eyes.     fluticasone (FLONASE) 50 MCG/ACT nasal spray Place into the nose daily.      Methylcellulose, Laxative, (CITRUCEL PO) Take 1 mg by mouth daily.     Current Facility-Administered Medications  Medication Dose Route Frequency Provider Last Rate Last Admin   0.9 %  sodium chloride infusion  500 mL Intravenous Once Tykerria Mccubbins, Lajuan Lines, MD        Allergies as of 02/13/2021 - Review Complete 02/13/2021  Allergen Reaction Noted   Paxlovid [nirmatrelvir-ritonavir] Shortness Of Breath 02/13/2021   Hydrocodone Nausea And Vomiting 12/22/2016   Penicillins Rash 06/26/2015    Family History  Problem Relation Age of Onset   Endometriosis Mother    Breast  cancer Mother    Hyperlipidemia Mother    Lymphoma Mother    Osteoarthritis Mother    COPD Mother    Cataracts Mother    Glaucoma Mother    Colon polyps Mother    Hyperlipidemia Father    Heart disease Father    Hypertension Father    Heart disease Maternal Grandmother    Heart disease Maternal Grandfather    Colon cancer Neg Hx    Esophageal cancer Neg Hx    Rectal cancer Neg Hx    Stomach cancer Neg Hx     Social History   Socioeconomic History   Marital status: Married  Spouse name: Not on file   Number of children: 1   Years of education: Not on file   Highest education level: Not on file  Occupational History   Occupation: retired  Tobacco Use   Smoking status: Never   Smokeless tobacco: Never  Vaping Use   Vaping Use: Never used  Substance and Sexual Activity   Alcohol use: No    Alcohol/week: 0.0 standard drinks   Drug use: No   Sexual activity: Not Currently  Other Topics Concern   Not on file  Social History Narrative   Not on file   Social Determinants of Health   Financial Resource Strain: Not on file  Food Insecurity: Not on file  Transportation Needs: Not on file  Physical Activity: Not on file  Stress: Not on file  Social Connections: Not on file  Intimate Partner Violence: Not on file    Physical Exam: Vital signs in last 24 hours: @BP  140/85 (BP Location: Right Arm, Patient Position: Sitting, Cuff Size: Normal)   Pulse 72   Temp (!) 97.1 F (36.2 C) (Temporal)   Ht 5\' 6"  (1.676 m)   Wt 178 lb (80.7 kg)   SpO2 97%   BMI 28.73 kg/m  GEN: NAD EYE: Sclerae anicteric ENT: MMM CV: Non-tachycardic Pulm: CTA b/l GI: Soft, NT/ND NEURO:  Alert & Oriented x 3   Zenovia Jarred, MD [facility]  02/13/2021 10:48 AM

## 2021-02-17 ENCOUNTER — Telehealth: Payer: Self-pay | Admitting: *Deleted

## 2021-02-17 ENCOUNTER — Encounter: Payer: Self-pay | Admitting: Internal Medicine

## 2021-02-17 NOTE — Telephone Encounter (Signed)
  Follow up Call-  Call back number 02/13/2021  Post procedure Call Back phone  # [phone]  Permission to leave phone message Yes  Some recent data might be hidden     Patient questions:  Do you have a fever, pain , or abdominal swelling? No. Pain Score  0 *  Have you tolerated food without any problems? Yes.    Have you been able to return to your normal activities? Yes.    Do you have any questions about your discharge instructions: Diet   No. Medications  No. Follow up visit  No.  Do you have questions or concerns about your Care? No.  Actions: * If pain score is 4 or above: No action needed, pain <4.  Have you developed a fever since your procedure? no  2.   Have you had an respiratory symptoms (SOB or cough) since your procedure? no  3.   Have you tested positive for COVID 19 since your procedure no  4.   Have you had any family members/close contacts diagnosed with the COVID 19 since your procedure?  no   If yes to any of these questions please route to Joylene John, RN and Joella Prince, RN

## 2021-04-30 ENCOUNTER — Encounter: Payer: Self-pay | Admitting: Internal Medicine

## 2021-05-01 MED ORDER — LINACLOTIDE 72 MCG PO CAPS: 72.0000 ug | ORAL_CAPSULE | Freq: Every day | ORAL | 3 refills | Status: DC

## 2021-05-01 MED ORDER — OMEPRAZOLE 40 MG PO CPDR: 40.0000 mg | DELAYED_RELEASE_CAPSULE | Freq: Every day | ORAL | 3 refills | Status: DC

## 2021-08-07 ENCOUNTER — Ambulatory Visit (INDEPENDENT_AMBULATORY_CARE_PROVIDER_SITE_OTHER): Payer: Medicare Other | Admitting: Podiatry

## 2021-08-07 ENCOUNTER — Ambulatory Visit (INDEPENDENT_AMBULATORY_CARE_PROVIDER_SITE_OTHER): Payer: Medicare Other

## 2021-08-07 DIAGNOSIS — M7751 Other enthesopathy of right foot: Secondary | ICD-10-CM

## 2021-08-07 DIAGNOSIS — R52 Pain, unspecified: Secondary | ICD-10-CM

## 2021-08-07 DIAGNOSIS — M778 Other enthesopathies, not elsewhere classified: Secondary | ICD-10-CM

## 2021-08-07 MED ORDER — BETAMETHASONE SOD PHOS & ACET 6 (3-3) MG/ML IJ SUSP: 3.0000 mg | Freq: Once | INTRAMUSCULAR | Status: DC

## 2021-08-07 MED ORDER — MELOXICAM 15 MG PO TABS: 15.0000 mg | ORAL_TABLET | Freq: Every day | ORAL | 1 refills | Status: DC

## 2021-08-07 NOTE — Patient Instructions (Signed)
[employer] ?

## 2021-08-07 NOTE — Progress Notes (Signed)
? ?Subjective: ?71 year old female presenting today with a new complaint of pain and tenderness to the right forefoot this been going on for several months now.  Patient states that her right great toe has become very painful and symptomatic despite different shoe gear modifications.  She does have a history of knee replacements bilateral.  She presents for further treatment and evaluation. ? ? ?Past Medical History:  ?Diagnosis Date  ? Anal fissure   ? Cataract   ? bilateral  ? Chicken pox   ? Colon polyps   ? Constipation   ? Diverticulitis   ? Endometrial cancer ([place])   ? Gallstones   ? GERD (gastroesophageal reflux disease)   ? Heartburn   ? Hiatal hernia   ? Hot flashes   ? Hyperlipidemia   ? Osteoarthritis   ? PONV (postoperative nausea and vomiting)   ? pt gets very nauseated, usually takes propofol for sedation  ? Status post dilation of esophageal narrowing   ? Uterine cancer ([place])   ? ?Past Surgical History:  ?Procedure Laterality Date  ? APPENDECTOMY  2000  ? BREAST BIOPSY Right   ? BREAST CYST ASPIRATION Left   ? CHOLECYSTECTOMY  1984  ? Cornea Laser Surgery Bilateral   ? HEMORROIDECTOMY  1973  ? MASS EXCISION N/A 12/22/2016  ? Procedure: BIOPSY OF MASS;  Surgeon: Izora Gala, MD;  Location: [facility];  Service: ENT;  Laterality: N/A;  ? MAXILLARY ANTROSTOMY N/A 12/22/2016  ? Procedure: LEFT MAXILLARY ANTROSTOMY REMOVAL TISSUE BIOPSY OF MASS;  Surgeon: Izora Gala, MD;  Location: [facility];  Service: ENT;  Laterality: N/A;  ? NASAL SINUS SURGERY    ? POLYPECTOMY    ? REPLACEMENT TOTAL KNEE Left 2012  ? REPLACEMENT TOTAL KNEE Right 2013  ? TOTAL ABDOMINAL HYSTERECTOMY  2000  ? UPPER GASTROINTESTINAL ENDOSCOPY    ? ?Allergies  ?Allergen Reactions  ? Paxlovid [Nirmatrelvir-Ritonavir] Shortness Of Breath  ? Hydrocodone Nausea And Vomiting  ? Penicillins Rash  ? ? ? ?Objective:  ?Physical Exam ?General: Alert and oriented x3 in no acute distress ? ?Dermatology: Skin is warm,  dry and supple bilateral lower extremities. Negative for open lesions or macerations. ? ?Vascular: Palpable pedal pulses bilaterally. No edema or erythema noted. Capillary refill within normal limits. ? ?Neurological: Epicritic and protective threshold grossly intact bilaterally.  ? ?Musculoskeletal Exam: Pain on palpation at the keratotic lesion noted. Pain with palpation noted to the left midfoot. Range of motion within normal limits bilateral. Muscle strength 5/5 in all groups bilateral.  Pain on palpation range of motion of the first MTP with slight crepitus right foot ? ?Radiographic exam: Degenerative changes noted throughout the midtarsal joint left foot.  There is also degenerative changes to the first MTP of the right foot with joint space narrowing and dorsal spurring. ? ?Assessment: ?1. DJD/capsulitis left midfoot ?2.  Hallux limitus/first MTP capsulitis right ? ? ?Plan of Care:  ?1. Patient evaluated ?2.  Injection of 0.5 cc Celestone Soluspan injected into the first MTP right as well as the left midtarsal joint ?3.  Recommend good supportive shoes and sneakers ?4.  Prescription for meloxicam 15 mg daily ?5.  Handicap parking placard form provided for the patient x6 months ?6.  Today we discussed possible surgery regarding the hallux limitus and degenerative changes to the first MTP of the right foot.  We did have this discussion a few years back as well.  Recommend arthroplasty with implant.  Details of  procedure were explained.  No guarantees were expressed or implied.  The patient would like to consider this down the road  ?7.  Return to clinic as needed ? ?Edrick Kins, DPM ?[facility] ? ?Dr. Edrick Kins, DPM  ?  ?[place]                                        ?[place], [place] [address]                ?Office [phone]  ?Fax [phone] ? ? ? ? ?

## 2021-12-16 ENCOUNTER — Other Ambulatory Visit: Payer: Self-pay | Admitting: Podiatry

## 2021-12-18 ENCOUNTER — Telehealth: Payer: Self-pay | Admitting: *Deleted

## 2021-12-18 MED ORDER — MELOXICAM 15 MG PO TABS: 15.0000 mg | ORAL_TABLET | Freq: Every day | ORAL | 1 refills | Status: DC

## 2021-12-18 NOTE — Telephone Encounter (Signed)
Patient is requesting a medication-meloxicam 15 mg refill , only 2 pills remaining, please advise

## 2021-12-24 ENCOUNTER — Telehealth: Payer: Self-pay | Admitting: Podiatry

## 2021-12-24 ENCOUNTER — Other Ambulatory Visit: Payer: Self-pay | Admitting: Podiatry

## 2021-12-24 MED ORDER — MELOXICAM 15 MG PO TABS: 15.0000 mg | ORAL_TABLET | Freq: Every day | ORAL | 1 refills | Status: DC

## 2021-12-24 NOTE — Telephone Encounter (Signed)
Notified pt that rx was sent in. She said thank you and that she could tell she was not taking it from the way she was feeling.

## 2021-12-24 NOTE — Telephone Encounter (Signed)
Rx sent. - Dr. Eiliana Drone

## 2021-12-24 NOTE — Telephone Encounter (Signed)
Pt called and stated she left message last week and is scheduled to see Dr Amalia Hailey on 9.26 but is out of her meloxicam. Could you call in enough to get her to her appt.

## 2022-01-05 ENCOUNTER — Ambulatory Visit (INDEPENDENT_AMBULATORY_CARE_PROVIDER_SITE_OTHER): Payer: Medicare Other | Admitting: Podiatry

## 2022-01-05 DIAGNOSIS — M778 Other enthesopathies, not elsewhere classified: Secondary | ICD-10-CM | POA: Diagnosis not present

## 2022-01-05 MED ORDER — MELOXICAM 15 MG PO TABS: 15.0000 mg | ORAL_TABLET | Freq: Every day | ORAL | 3 refills | Status: DC

## 2022-01-05 NOTE — Progress Notes (Signed)
Chief Complaint  Patient presents with   Follow-up    Patient is here for follow-up needs rx refill on medication and handicap placard.    Subjective: 71 year old female presenting today for follow-up evaluation of pain and tenderness to the right forefoot this been going on for several months now.  Patient states that the meloxicam helped significantly.  She takes it daily with relief of her symptoms.  She presents for further treatment and evaluation and requesting a refill of the meloxicam today   Past Medical History:  Diagnosis Date   Anal fissure    Cataract    bilateral   Chicken pox    Colon polyps    Constipation    Diverticulitis    Endometrial cancer ([place])    Gallstones    GERD (gastroesophageal reflux disease)    Heartburn    Hiatal hernia    Hot flashes    Hyperlipidemia    Osteoarthritis    PONV (postoperative nausea and vomiting)    pt gets very nauseated, usually takes propofol for sedation   Status post dilation of esophageal narrowing    Uterine cancer ([place])    Past Surgical History:  Procedure Laterality Date   APPENDECTOMY  2000   BREAST BIOPSY Right    BREAST CYST ASPIRATION Left    CHOLECYSTECTOMY  1984   Cornea Laser Surgery Bilateral    HEMORROIDECTOMY  1973   MASS EXCISION N/A 12/22/2016   Procedure: BIOPSY OF MASS;  Surgeon: Izora Gala, MD;  Location: [facility];  Service: ENT;  Laterality: N/A;   MAXILLARY ANTROSTOMY N/A 12/22/2016   Procedure: LEFT MAXILLARY ANTROSTOMY REMOVAL TISSUE BIOPSY OF MASS;  Surgeon: Izora Gala, MD;  Location: [facility];  Service: ENT;  Laterality: N/A;   NASAL SINUS SURGERY     POLYPECTOMY     REPLACEMENT TOTAL KNEE Left 2012   REPLACEMENT TOTAL KNEE Right 2013   TOTAL ABDOMINAL HYSTERECTOMY  2000   UPPER GASTROINTESTINAL ENDOSCOPY     Allergies  Allergen Reactions   Paxlovid [Nirmatrelvir-Ritonavir] Shortness Of Breath   Hydrocodone Nausea And Vomiting   Penicillins Rash      Objective:  Physical Exam General: Alert and oriented x3 in no acute distress  Dermatology: Skin is warm, dry and supple bilateral lower extremities. Negative for open lesions or macerations.  Vascular: Palpable pedal pulses bilaterally. No edema or erythema noted. Capillary refill within normal limits.  Neurological: Epicritic and protective threshold grossly intact bilaterally.   Musculoskeletal Exam: Tenderness to palpation at the keratotic lesion noted. Pain with palpation noted to the left midfoot. Range of motion within normal limits bilateral. Muscle strength 5/5 in all groups bilateral.  Pain on palpation range of motion of the first MTP with slight crepitus right foot  Radiographic exam B/L feet 08/07/2021: Degenerative changes noted throughout the midtarsal joint left foot.  There is also degenerative changes to the first MTP of the right foot with joint space narrowing and dorsal spurring.  Assessment: 1. DJD/capsulitis left midfoot 2.  Hallux limitus/first MTP capsulitis right   Plan of Care:  1. Patient evaluated 2.  Today the patient is doing well.  She says the meloxicam helped significantly. 3.  Refill prescription for meloxicam 15 mg daily as needed 4.  Continue wearing good supportive shoes and sneakers.  We will continue to pursue conservative treatment for now 5.  Return to clinic as needed  Edrick Kins, DPM [facility]  Dr.  Edrick Kins, DPM    [address]                                        [place],  [address]                Office [phone]  Fax [phone]

## 2022-02-17 ENCOUNTER — Ambulatory Visit (INDEPENDENT_AMBULATORY_CARE_PROVIDER_SITE_OTHER): Payer: Medicare Other | Admitting: Podiatry

## 2022-02-17 DIAGNOSIS — M722 Plantar fascial fibromatosis: Secondary | ICD-10-CM

## 2022-02-17 DIAGNOSIS — R52 Pain, unspecified: Secondary | ICD-10-CM

## 2022-02-17 MED ORDER — METHYLPREDNISOLONE 4 MG PO TBPK: ORAL_TABLET | ORAL | 0 refills | Status: DC

## 2022-02-17 MED ORDER — BETAMETHASONE SOD PHOS & ACET 6 (3-3) MG/ML IJ SUSP
3.0000 mg | Freq: Once | INTRAMUSCULAR | Status: AC
  Administered 2022-02-17: 3 mg via INTRA_ARTICULAR

## 2022-02-17 NOTE — Progress Notes (Signed)
Chief Complaint  Patient presents with   Foot Pain    Follow up left foot pain. PT states in the last 3 weeks she has had left heel pain. Pt has favored right foot due to the pain and has cause ball of foot pain in right foot. Pt states today is a good day.     Subjective: 71 year old female presenting today for new complaint of pain and tenderness associated to the left plantar heel.  Patient states that she has had left plantar heel pain for about 3 weeks.  Idiopathic onset.  She denies a history of injury and cannot recall any incident that would have elicited the pain.  She does have a long history of DJD/capsulitis to the left midfoot as well as the right great toe joint.  Past Medical History:  Diagnosis Date   Anal fissure    Cataract    bilateral   Chicken pox    Colon polyps    Constipation    Diverticulitis    Endometrial cancer ([facility])    Gallstones    GERD (gastroesophageal reflux disease)    Heartburn    Hiatal hernia    Hot flashes    Hyperlipidemia    Osteoarthritis    PONV (postoperative nausea and vomiting)    pt gets very nauseated, usually takes propofol for sedation   Status post dilation of esophageal narrowing    Uterine cancer ([place])    Past Surgical History:  Procedure Laterality Date   APPENDECTOMY  2000   BREAST BIOPSY Right    BREAST CYST ASPIRATION Left    CHOLECYSTECTOMY  1984   Cornea Laser Surgery Bilateral    HEMORROIDECTOMY  1973   MASS EXCISION N/A 12/22/2016   Procedure: BIOPSY OF MASS;  Surgeon: Izora Gala, MD;  [facility];  Service: ENT;  Laterality: N/A;   MAXILLARY ANTROSTOMY N/A 12/22/2016   Procedure: LEFT MAXILLARY ANTROSTOMY REMOVAL TISSUE BIOPSY OF MASS;  Surgeon: Izora Gala, MD;  Location: [facility];  Service: ENT;  Laterality: N/A;   NASAL SINUS SURGERY     POLYPECTOMY     REPLACEMENT TOTAL KNEE Left 2012   REPLACEMENT TOTAL KNEE Right 2013   TOTAL ABDOMINAL HYSTERECTOMY  2000    UPPER GASTROINTESTINAL ENDOSCOPY     Allergies  Allergen Reactions   Paxlovid [Nirmatrelvir-Ritonavir] Shortness Of Breath   Hydrocodone Nausea And Vomiting   Penicillins Rash     Objective:  Physical Exam General: Alert and oriented x3 in no acute distress  Dermatology: Skin is warm, dry and supple bilateral lower extremities. Negative for open lesions or macerations.  Vascular: Palpable pedal pulses bilaterally. No edema or erythema noted. Capillary refill within normal limits.  Neurological: Epicritic and protective threshold grossly intact bilaterally.   Musculoskeletal Exam: Tenderness to palpation along the plantar fascia and left heel.  Radiographic exam B/L feet 08/07/2021: Degenerative changes noted throughout the midtarsal joint left foot.  There is also degenerative changes to the first MTP of the right foot with joint space narrowing and dorsal spurring.  Assessment: 1. DJD/capsulitis left midfoot 2.  Hallux limitus/first MTP capsulitis right 3.  Acute onset of plantar fasciitis left   Plan of Care:  1. Patient evaluated 2.  Injection of 0.5 cc Celestone Soluspan injected in the plantar fascia left 3.  Prescription for Medrol Dosepak, then resume meloxicam 15 mg daily 4.  Continue wearing good supportive shoes and sneakers 5.  Patient has her orthotics today and  she would like to have them full length instead of sulcus length. We will get them modified.  6.  Return to clinic 4 weeks  Edrick Kins, DPM [facility]  Dr. Edrick Kins, [address]                                        [place], [place] [address]                Office [phone]  Fax [phone]

## 2022-02-24 ENCOUNTER — Telehealth: Payer: Self-pay | Admitting: Podiatry

## 2022-02-24 NOTE — Telephone Encounter (Signed)
LMOM to call back to pick up orthotics

## 2022-03-02 ENCOUNTER — Ambulatory Visit (INDEPENDENT_AMBULATORY_CARE_PROVIDER_SITE_OTHER): Payer: Self-pay

## 2022-03-02 DIAGNOSIS — M722 Plantar fascial fibromatosis: Secondary | ICD-10-CM

## 2022-03-02 NOTE — Progress Notes (Signed)
Patient presents today to pick up custom molded foot orthotics, diagnosed with plantar fasciitis by Dr. Amalia Hailey.   Orthotics were dispensed and fit was satisfactory. Reviewed instructions for break-in and wear. Written instructions given to patient.  Patient will follow up as needed.   [facility] - order # [id number]

## 2022-03-19 ENCOUNTER — Ambulatory Visit (INDEPENDENT_AMBULATORY_CARE_PROVIDER_SITE_OTHER): Payer: Medicare Other | Admitting: Podiatry

## 2022-03-19 DIAGNOSIS — M722 Plantar fascial fibromatosis: Secondary | ICD-10-CM | POA: Diagnosis not present

## 2022-03-19 NOTE — Progress Notes (Unsigned)
Chief Complaint  Patient presents with   Foot Pain    Patient is here for left heel pain, she states that she has iced the left heel, she also states that she may have possible toe nail fungus bilateral toes.    Subjective: 71 year old female presenting today for follow-up evaluation of plantar fasciitis to the left heel.  Patient states the last cortisone injection did not help alleviate any of her symptoms.  She continues to have pain and tenderness.  Patient also states that she has noticed some discoloration to the left second toe and a small portion of the left great toe.  Acute onset about 2 weeks ago.  Presenting for further treatment and evaluation  Past Medical History:  Diagnosis Date   Anal fissure    Cataract    bilateral   Chicken pox    Colon polyps    Constipation    Diverticulitis    Endometrial cancer ([place])    Gallstones    GERD (gastroesophageal reflux disease)    Heartburn    Hiatal hernia    Hot flashes    Hyperlipidemia    Osteoarthritis    PONV (postoperative nausea and vomiting)    pt gets very nauseated, usually takes propofol for sedation   Status post dilation of esophageal narrowing    Uterine cancer ([place])    Past Surgical History:  Procedure Laterality Date   APPENDECTOMY  2000   BREAST BIOPSY Right    BREAST CYST ASPIRATION Left    CHOLECYSTECTOMY  1984   Cornea Laser Surgery Bilateral    HEMORROIDECTOMY  1973   MASS EXCISION N/A 12/22/2016   Procedure: BIOPSY OF MASS;  Surgeon: Izora Gala, MD;  Location: [facility];  Service: ENT;  Laterality: N/A;   MAXILLARY ANTROSTOMY N/A 12/22/2016   Procedure: LEFT MAXILLARY ANTROSTOMY REMOVAL TISSUE BIOPSY OF MASS;  Surgeon: Izora Gala, MD;  Location: [facility];  Service: ENT;  Laterality: N/A;   NASAL SINUS SURGERY     POLYPECTOMY     REPLACEMENT TOTAL KNEE Left 2012   REPLACEMENT TOTAL KNEE Right 2013   TOTAL ABDOMINAL HYSTERECTOMY  2000   UPPER  GASTROINTESTINAL ENDOSCOPY     Allergies  Allergen Reactions   Paxlovid [Nirmatrelvir-Ritonavir] Shortness Of Breath   Hydrocodone Nausea And Vomiting   Penicillins Rash     Objective:  Physical Exam General: Alert and oriented x3 in no acute distress  Dermatology: Skin is warm, dry and supple bilateral lower extremities. Negative for open lesions or macerations.  Small focal subungual hematoma is noted to the second digit and great toe of the left foot  Vascular: Palpable pedal pulses bilaterally. No edema or erythema noted. Capillary refill within normal limits.  Neurological: Epicritic and protective threshold grossly intact bilaterally.   Musculoskeletal Exam: There continues to be tenderness to palpation along the plantar fascia and left heel.  Radiographic exam B/L feet 08/07/2021: Degenerative changes noted throughout the midtarsal joint left foot.  There is also degenerative changes to the first MTP of the right foot with joint space narrowing and dorsal spurring.  Assessment: 1. DJD/capsulitis left midfoot 2.  Hallux limitus/first MTP capsulitis right 3.  Plantar fasciitis left 4.  Subungual hematomas left second digit and hallux   Plan of Care:  1. Patient evaluated 2.  Continue meloxicam 15 mg daily 3.  Patient continues to have significant pain and tenderness associated to the left plantar heel.  Today we are going to immobilize  the patient in a cam boot.  Cam boot dispensed.  WBAT 4.  In regards to the subungual hematomas of the toenails, simply observe for now to see if they begin to grow out with time 5.  Return to clinic 3 weeks   Edrick Kins, DPM [facility]  Dr. Edrick Kins, [address]                                        [place], [place] [address]                Office [phone]  Fax [phone]

## 2022-03-26 ENCOUNTER — Ambulatory Visit: Payer: Medicare Other | Admitting: Podiatry

## 2022-04-09 ENCOUNTER — Ambulatory Visit (INDEPENDENT_AMBULATORY_CARE_PROVIDER_SITE_OTHER): Payer: Medicare Other | Admitting: Podiatry

## 2022-04-09 DIAGNOSIS — M722 Plantar fascial fibromatosis: Secondary | ICD-10-CM

## 2022-04-09 NOTE — Progress Notes (Signed)
Chief Complaint  Patient presents with   Follow-up    Follow up on the left foot, patient said boot didn't work for her, treatment she opt for was dr scholls inserts     Subjective: 71 year old female presenting today for follow-up evaluation of plantar fasciitis to the left heel.  Patient states that overall the heel is feeling significantly better.  She discontinued the cam boot and is now wearing Dr. Felicie Morn heel cushions with Vionic shoes and this seems to be helping significantly.  Past Medical History:  Diagnosis Date   Anal fissure    Cataract    bilateral   Chicken pox    Colon polyps    Constipation    Diverticulitis    Endometrial cancer ([facility])    Gallstones    GERD (gastroesophageal reflux disease)    Heartburn    Hiatal hernia    Hot flashes    Hyperlipidemia    Osteoarthritis    PONV (postoperative nausea and vomiting)    pt gets very nauseated, usually takes propofol for sedation   Status post dilation of esophageal narrowing    Uterine cancer ([place])    Past Surgical History:  Procedure Laterality Date   APPENDECTOMY  2000   BREAST BIOPSY Right    BREAST CYST ASPIRATION Left    CHOLECYSTECTOMY  1984   Cornea Laser Surgery Bilateral    HEMORROIDECTOMY  1973   MASS EXCISION N/A 12/22/2016   Procedure: BIOPSY OF MASS;  Surgeon: Izora Gala, MD;  Location: [facility];  Service: ENT;  Laterality: N/A;   MAXILLARY ANTROSTOMY N/A 12/22/2016   Procedure: LEFT MAXILLARY ANTROSTOMY REMOVAL TISSUE BIOPSY OF MASS;  Surgeon: Izora Gala, MD;  Location: [facility];  Service: ENT;  Laterality: N/A;   NASAL SINUS SURGERY     POLYPECTOMY     REPLACEMENT TOTAL KNEE Left 2012   REPLACEMENT TOTAL KNEE Right 2013   TOTAL ABDOMINAL HYSTERECTOMY  2000   UPPER GASTROINTESTINAL ENDOSCOPY     Allergies  Allergen Reactions   Oxycodone Nausea Only and Other (See Comments)   Paxlovid [Nirmatrelvir-Ritonavir] Shortness Of Breath   Hydrocodone  Nausea And Vomiting   Penicillins Rash     Objective:  Physical Exam General: Alert and oriented x3 in no acute distress  Dermatology: Skin is warm, dry and supple bilateral lower extremities. Negative for open lesions or macerations.  Small focal subungual hematoma is noted to the second digit and great toe of the left foot  Vascular: Palpable pedal pulses bilaterally. No edema or erythema noted. Capillary refill within normal limits.  Neurological: Epicritic and protective threshold grossly intact bilaterally.   Musculoskeletal Exam: There continues to be tenderness to palpation along the plantar fascia and left heel.  Radiographic exam B/L feet 08/07/2021: Degenerative changes noted throughout the midtarsal joint left foot.  There is also degenerative changes to the first MTP of the right foot with joint space narrowing and dorsal spurring.  Assessment: 1. DJD/capsulitis left midfoot 2.  Hallux limitus/first MTP capsulitis right 3.  Plantar fasciitis left 4.  Subungual hematomas left second digit and hallux   Plan of Care:  1. Patient evaluated 2.  Continue meloxicam 15 mg daily as needed 3.  Continue Dr. Felicie Morn heel cushions with Vionic shoes since this seems to be helping significantly alleviate her heel pain 4.  Will observe the subungual hematomas for now since this should grow out uneventfully 5.  Return to clinic as needed  Dorathy Daft.  Amalia Hailey, DPM [facility]  Dr. Edrick Kins, DPM    [address], [place] [address]                Office [phone]  Fax [phone]

## 2022-04-15 ENCOUNTER — Other Ambulatory Visit: Payer: Self-pay

## 2022-04-15 MED ORDER — OMEPRAZOLE 40 MG PO CPDR: 40.0000 mg | DELAYED_RELEASE_CAPSULE | Freq: Every day | ORAL | 2 refills | Status: DC

## 2022-04-15 MED ORDER — LINACLOTIDE 72 MCG PO CAPS
72.0000 ug | ORAL_CAPSULE | Freq: Every day | ORAL | 2 refills | Status: DC
Start: 2022-04-15 — End: 2022-05-12

## 2022-04-22 ENCOUNTER — Encounter: Payer: Self-pay | Admitting: Podiatry

## 2022-04-23 ENCOUNTER — Other Ambulatory Visit: Payer: Self-pay | Admitting: Podiatry

## 2022-04-23 MED ORDER — MELOXICAM 15 MG PO TABS: 15.0000 mg | ORAL_TABLET | Freq: Every day | ORAL | 1 refills | Status: DC

## 2022-05-12 ENCOUNTER — Telehealth: Payer: Self-pay | Admitting: Internal Medicine

## 2022-05-12 ENCOUNTER — Other Ambulatory Visit: Payer: Self-pay | Admitting: Internal Medicine

## 2022-05-12 MED ORDER — LINACLOTIDE 72 MCG PO CAPS: 72.0000 ug | ORAL_CAPSULE | Freq: Every day | ORAL | 2 refills | Status: DC

## 2022-05-12 MED ORDER — OMEPRAZOLE 40 MG PO CPDR: 40.0000 mg | DELAYED_RELEASE_CAPSULE | Freq: Every day | ORAL | 2 refills | Status: DC

## 2022-05-12 NOTE — Telephone Encounter (Signed)
Inbound call from pharmacy stated they have not received any prescription for Linzess or Omeprazole. Please advise.

## 2022-05-12 NOTE — Telephone Encounter (Signed)
I spoke with Jeanett Schlein and she doesn't use [facility] any more due to her insurance. I called the pharmacy and cancelled the rx's I had sent. I have updated the pharmacy in her chart. She is now with [employer] drug coverage. [facility] a [facility] I told her to get them to scan in her card.

## 2022-08-16 ENCOUNTER — Telehealth: Payer: Self-pay | Admitting: Internal Medicine

## 2022-08-16 MED ORDER — LINACLOTIDE 72 MCG PO CAPS: 72.0000 ug | ORAL_CAPSULE | Freq: Every day | ORAL | 0 refills | Status: DC

## 2022-08-16 NOTE — Telephone Encounter (Signed)
Rx sent 

## 2022-08-16 NOTE — Telephone Encounter (Signed)
Inbound call from patient requesting 5 day supply of Linzess be sent into [facility] due to her forgetting her prescription at home. Patient is on vacation.  [facility] [phone] [address], [place].  Please advise.

## 2022-10-22 ENCOUNTER — Other Ambulatory Visit: Payer: Self-pay | Admitting: Podiatry

## 2022-10-22 NOTE — Telephone Encounter (Signed)
It has been over 6 months since she was last seen.  Per [facility] office protocol, she'll need to come in and be seen by Dr. Logan Bores before additional refills can be prescribed.

## 2022-11-23 ENCOUNTER — Other Ambulatory Visit: Payer: Self-pay | Admitting: Podiatry

## 2022-11-23 ENCOUNTER — Telehealth: Payer: Self-pay | Admitting: Podiatry

## 2022-11-23 MED ORDER — MELOXICAM 15 MG PO TABS: 15.0000 mg | ORAL_TABLET | Freq: Every day | ORAL | 1 refills | Status: DC

## 2022-11-23 NOTE — Telephone Encounter (Signed)
Pt needs a refill on her Meloxicam 15mg 

## 2022-12-22 ENCOUNTER — Ambulatory Visit (INDEPENDENT_AMBULATORY_CARE_PROVIDER_SITE_OTHER): Payer: Medicare Other | Admitting: Internal Medicine

## 2022-12-22 ENCOUNTER — Encounter: Payer: Self-pay | Admitting: Internal Medicine

## 2022-12-22 VITALS — BP 122/88 | HR 74 | Ht 66.0 in | Wt 180.0 lb

## 2022-12-22 DIAGNOSIS — K219 Gastro-esophageal reflux disease without esophagitis: Secondary | ICD-10-CM | POA: Diagnosis not present

## 2022-12-22 DIAGNOSIS — R140 Abdominal distension (gaseous): Secondary | ICD-10-CM | POA: Diagnosis not present

## 2022-12-22 DIAGNOSIS — K5909 Other constipation: Secondary | ICD-10-CM | POA: Diagnosis not present

## 2022-12-22 NOTE — Patient Instructions (Addendum)
Take your Linzess 72 mcg samples 2 capsules daily x 2 weeks, then send a MyChart message with an update on your symptoms.   Continue omeprazole daily.   _______________________________________________________  If your blood pressure at your visit was 140/90 or greater, please contact your primary care physician to follow up on this.  _______________________________________________________  If you are age 74 or older, your body mass index should be between 23-30. Your Body mass index is 29.05 kg/m. If this is out of the aforementioned range listed, please consider follow up with your Primary Care Provider.  If you are age 70 or younger, your body mass index should be between 19-25. Your Body mass index is 29.05 kg/m. If this is out of the aformentioned range listed, please consider follow up with your Primary Care Provider.   ________________________________________________________  The [facility] providers would like to encourage you to use [facility] to communicate with providers for non-urgent requests or questions.  Due to long hold times on the telephone, sending your provider a message by [facility] may be a faster and more efficient way to get a response.  Please allow 48 business hours for a response.  Please remember that this is for non-urgent requests.  _______________________________________________________

## 2022-12-22 NOTE — Progress Notes (Signed)
   Subjective:    Patient ID: Jessica Chavez, female    DOB: [birth year], 72 y.o.   MRN: [id number]  HPI Jessica Chavez is a 72 year old female with a history of chronic constipation, GERD with dysphagia, hiatal hernia, multiple adenomatous colon polyps including those greater than a centimeter who is here for follow-up.  She is here alone today and was last seen on 12/02/2020.  She reports that she is having ongoing issues with constipation despite Linzess 72 mcg daily.  She gets severely constipated and feels bloating fullness this worsens indigestion.  She has had several episodes of nocturnal vomiting when constipation was most severe.  After passing hard stool she will have overflow diarrhea and then the cycle will restart.  No blood in stool or melena.  Previously Amitiza 8 mcg twice daily was working well but not covered by her insurance.  She is continuing omeprazole 40 mg daily.  Heartburn for the most part is well-controlled and less she is severely constipated.  Her swallowing has improved though she does occasionally have liquid dysphagia.   Review of Systems As per HPI, otherwise negative  Current Medications, Allergies, Past Medical History, Past Surgical History, Family History and Social History were reviewed in [employer] record.    Objective:   Physical Exam BP 122/88   Pulse 74   Ht 5\' 6"  (1.676 m)   Wt 180 lb (81.6 kg)   BMI 29.05 kg/m  Gen: awake, alert, NAD HEENT: anicteric  Ext: no c/c/e Neuro: nonfocal      Assessment & Plan:  72 year old female with a history of chronic constipation, GERD with dysphagia, hiatal hernia, multiple adenomatous colon polyps including those greater than a centimeter who is here for follow-up.   Chronic idiopathic constipation --not in control with Linzess 72 mcg daily.  Plan as follows -- Increase Linzess to 145 mcg daily x 2 weeks; she is asked to send me a MyChart message in 2 weeks and if not improved  then try the 290 mcg daily dose -- If Linzess ultimately ineffective then we can retry Amitiza now that she has tried and failed Linzess  2.  GERD/dysphagia --responsive to dilation from a dysphagia perspective though her liquid dysphagia is likely in part related to mild dysmotility.  No history of Barrett's esophagus. -- Continue omeprazole 40 mg daily  3.  History of multiple colon polyps --surveillance colonoscopy recommended at the 5-year interval based on her last result which would be November 2027  30 minutes total spent today including patient facing time, coordination of care, reviewing medical history/procedures/pertinent radiology studies, and documentation of the encounter.

## 2023-01-04 ENCOUNTER — Ambulatory Visit (INDEPENDENT_AMBULATORY_CARE_PROVIDER_SITE_OTHER): Payer: Medicare Other | Admitting: Podiatry

## 2023-01-04 ENCOUNTER — Encounter: Payer: Self-pay | Admitting: Podiatry

## 2023-01-04 VITALS — BP 146/57 | HR 79

## 2023-01-04 DIAGNOSIS — M722 Plantar fascial fibromatosis: Secondary | ICD-10-CM | POA: Diagnosis not present

## 2023-01-04 MED ORDER — METHYLPREDNISOLONE 4 MG PO TBPK: ORAL_TABLET | ORAL | 0 refills | Status: DC

## 2023-01-04 MED ORDER — MELOXICAM 15 MG PO TABS: 15.0000 mg | ORAL_TABLET | Freq: Every day | ORAL | 1 refills | Status: DC

## 2023-01-04 MED ORDER — BETAMETHASONE SOD PHOS & ACET 6 (3-3) MG/ML IJ SUSP
3.0000 mg | Freq: Once | INTRAMUSCULAR | Status: AC
Start: 2023-01-04 — End: 2023-01-04
  Administered 2023-01-04: 3 mg via INTRA_ARTICULAR

## 2023-01-04 NOTE — Progress Notes (Signed)
Chief Complaint  Patient presents with   Plantar Fasciitis    "It's better today but I could hardly walk on it last week.  I have pain in my right foot now like I was having in the left one.  I want to catch it before it gets worse like the left one." N - heel pain L - plantar heel right D - 3 weeks O - suddenly C - tender, sore A - walking in house without shoes T - ice, Dr. Margart Sickles foot inserts    Subjective: 72 year old female presenting today new onset of the pain and tenderness associated to the right plantar heel.  Concerning for plantar fasciitis to the right foot.  She does have a history of plantar fasciitis to the left foot.  Onset about 3 weeks ago.  She denies any change in activity or shoe gear.  Past Medical History:  Diagnosis Date   Anal fissure    Cataract    bilateral   Chicken pox    Colon polyps    Constipation    Diverticulitis    Endometrial cancer ([facility])    Gallstones    GERD (gastroesophageal reflux disease)    Heartburn    Hiatal hernia    Hot flashes    Hyperlipidemia    Osteoarthritis    PONV (postoperative nausea and vomiting)    pt gets very nauseated, usually takes propofol for sedation   Status post dilation of esophageal narrowing    Uterine cancer ([facility])    Past Surgical History:  Procedure Laterality Date   APPENDECTOMY  2000   BREAST BIOPSY Right    BREAST CYST ASPIRATION Left    CHOLECYSTECTOMY  1984   Cornea Laser Surgery Bilateral    HEMORROIDECTOMY  1973   MASS EXCISION N/A 12/22/2016   Procedure: BIOPSY OF MASS;  Surgeon: Serena Colonel, MD;  Location: [facility];  Service: ENT;  Laterality: N/A;   MAXILLARY ANTROSTOMY N/A 12/22/2016   Procedure: LEFT MAXILLARY ANTROSTOMY REMOVAL TISSUE BIOPSY OF MASS;  Surgeon: Serena Colonel, MD;  Location: [facility];  Service: ENT;  Laterality: N/A;   NASAL SINUS SURGERY     POLYPECTOMY     REPLACEMENT TOTAL KNEE Left 2012   REPLACEMENT TOTAL KNEE Right 2013    TOTAL ABDOMINAL HYSTERECTOMY  2000   UPPER GASTROINTESTINAL ENDOSCOPY     Allergies  Allergen Reactions   Oxycodone Nausea Only and Other (See Comments)   Paxlovid [Nirmatrelvir-Ritonavir] Shortness Of Breath   Hydrocodone Nausea And Vomiting   Penicillins Rash     Objective:  Physical Exam General: Alert and oriented x3 in no acute distress  Dermatology: Skin is warm, dry and supple bilateral lower extremities. Negative for open lesions or macerations.    Vascular: Palpable pedal pulses bilaterally. No edema or erythema noted. Capillary refill within normal limits.  Neurological: Grossly intact via light touch  Musculoskeletal Exam: Pain and tenderness associated with palpation to the plantar fascia of the right heel  Radiographic exam B/L feet 08/07/2021: Degenerative changes noted throughout the midtarsal joint left foot.  There is also degenerative changes to the first MTP of the right foot with joint space narrowing and dorsal spurring.  Assessment: 1. DJD/capsulitis left midfoot; chronic, stable 2.  Hallux limitus/first MTP right; chronic, stable 3.  Plantar fasciitis right    Plan of Care:  -Patient evaluated -Injection of 0.5 cc Celestone Soluspan injected in the plantar fascia right -Prescription for Medrol Dosepak -Refill prescription  for meloxicam 15 mg daily #90 with 1 refill -Continue wearing good supportive shoes with Dr. Margart Sickles Gel Heel Cushions -Return to clinic as needed  Felecia Shelling, DPM [facility]  Dr. Felecia Shelling, DPM    [address] [place], [place] [address]                Office [phone]  Fax [phone]

## 2023-01-07 ENCOUNTER — Encounter: Payer: Self-pay | Admitting: Internal Medicine

## 2023-01-17 ENCOUNTER — Other Ambulatory Visit: Payer: Self-pay

## 2023-01-17 MED ORDER — LINACLOTIDE 290 MCG PO CAPS: 290.0000 ug | ORAL_CAPSULE | Freq: Every day | ORAL | 3 refills | Status: DC

## 2023-01-20 ENCOUNTER — Other Ambulatory Visit: Payer: Self-pay | Admitting: Internal Medicine

## 2023-03-02 ENCOUNTER — Other Ambulatory Visit: Payer: Self-pay | Admitting: Obstetrics and Gynecology

## 2023-03-02 DIAGNOSIS — Z1239 Encounter for other screening for malignant neoplasm of breast: Secondary | ICD-10-CM

## 2023-04-20 ENCOUNTER — Ambulatory Visit
Admission: RE | Admit: 2023-04-20 | Discharge: 2023-04-20 | Disposition: A | Discharge status: Home / Self Care | Payer: Medicare Other | Source: Ambulatory Visit | Attending: Obstetrics and Gynecology | Admitting: Obstetrics and Gynecology

## 2023-04-20 DIAGNOSIS — Z1239 Encounter for other screening for malignant neoplasm of breast: Secondary | ICD-10-CM

## 2023-04-20 MED ORDER — GADOPICLENOL 0.5 MMOL/ML IV SOLN
7.5000 mL | Freq: Once | INTRAVENOUS | Status: AC | PRN
  Administered 2023-04-20: 7.5 mL via INTRAVENOUS

## 2023-04-21 ENCOUNTER — Other Ambulatory Visit: Payer: Self-pay | Admitting: Obstetrics and Gynecology

## 2023-04-21 DIAGNOSIS — R928 Other abnormal and inconclusive findings on diagnostic imaging of breast: Secondary | ICD-10-CM

## 2023-04-28 ENCOUNTER — Other Ambulatory Visit: Payer: Self-pay | Admitting: Diagnostic Radiology

## 2023-04-28 ENCOUNTER — Ambulatory Visit
Admission: RE | Admit: 2023-04-28 | Discharge: 2023-04-28 | Disposition: A | Discharge status: Home / Self Care | Payer: Medicare Other | Source: Ambulatory Visit | Attending: Obstetrics and Gynecology | Admitting: Obstetrics and Gynecology

## 2023-04-28 ENCOUNTER — Ambulatory Visit
Admission: RE | Admit: 2023-04-28 | Discharge: 2023-04-28 | Discharge status: Home / Self Care | Payer: Medicare Other | Source: Ambulatory Visit | Attending: Obstetrics and Gynecology | Admitting: Obstetrics and Gynecology

## 2023-04-28 DIAGNOSIS — R928 Other abnormal and inconclusive findings on diagnostic imaging of breast: Secondary | ICD-10-CM

## 2023-04-28 MED ORDER — GADOPICLENOL 0.5 MMOL/ML IV SOLN
7.0000 mL | Freq: Once | INTRAVENOUS | Status: AC | PRN
  Administered 2023-04-28: 7 mL via INTRAVENOUS

## 2023-04-29 LAB — SURGICAL PATHOLOGY

## 2023-06-07 ENCOUNTER — Ambulatory Visit (INDEPENDENT_AMBULATORY_CARE_PROVIDER_SITE_OTHER): Payer: Medicare Other

## 2023-06-07 ENCOUNTER — Ambulatory Visit (INDEPENDENT_AMBULATORY_CARE_PROVIDER_SITE_OTHER): Payer: Medicare Other | Admitting: Podiatry

## 2023-06-07 DIAGNOSIS — M79674 Pain in right toe(s): Secondary | ICD-10-CM

## 2023-06-07 DIAGNOSIS — M19071 Primary osteoarthritis, right ankle and foot: Secondary | ICD-10-CM | POA: Diagnosis not present

## 2023-06-07 DIAGNOSIS — Q667 Congenital pes cavus, unspecified foot: Secondary | ICD-10-CM | POA: Diagnosis not present

## 2023-06-07 DIAGNOSIS — M79675 Pain in left toe(s): Secondary | ICD-10-CM | POA: Diagnosis not present

## 2023-06-07 DIAGNOSIS — M898X9 Other specified disorders of bone, unspecified site: Secondary | ICD-10-CM

## 2023-06-07 DIAGNOSIS — B351 Tinea unguium: Secondary | ICD-10-CM | POA: Diagnosis not present

## 2023-06-07 MED ORDER — BETAMETHASONE SOD PHOS & ACET 6 (3-3) MG/ML IJ SUSP
3.0000 mg | Freq: Once | INTRAMUSCULAR | Status: AC
  Administered 2023-06-07: 3 mg via INTRA_ARTICULAR

## 2023-06-07 NOTE — Progress Notes (Signed)
 Chief Complaint  Patient presents with   Foot Pain    "My right foot feels like there's fever.  It hurts.  I'd like him to check my toes." N - top of foot pain  L - dorsal pain right D - November 2024 O - off and on C - feels like fever, ache A - walking T - none   Nail Problem    "My toenails look awful." N - toenails L - 1-5 bilateral D - 1 year O - gradually worse C - yellow, look dead A - none T - none    HPI: 73 y.o. female presenting today for right foot pain ongoing and minimally for several months.  Patient states that recently her right foot has flared up and is very tender and painful.  Denies a history of injury.  She has a long history of arthritis throughout her body  Patient also would like to have her toenails evaluated again.  She says there dystrophic and somewhat tender with palpation.  Past Medical History:  Diagnosis Date   Anal fissure    Cataract    bilateral   Chicken pox    Colon polyps    Constipation    Diverticulitis    Endometrial cancer ([facility])    Gallstones    GERD (gastroesophageal reflux disease)    Heartburn    Hiatal hernia    Hot flashes    Hyperlipidemia    Osteoarthritis    PONV (postoperative nausea and vomiting)    pt gets very nauseated, usually takes propofol for sedation   Status post dilation of esophageal narrowing    Uterine cancer ([facility])     Past Surgical History:  Procedure Laterality Date   APPENDECTOMY  2000   BREAST BIOPSY Right    BREAST CYST ASPIRATION Left    CHOLECYSTECTOMY  1984   Cornea Laser Surgery Bilateral    HEMORROIDECTOMY  1973   MASS EXCISION N/A 12/22/2016   Procedure: BIOPSY OF MASS;  Surgeon: Serena Colonel, MD;  Location: [facility];  Service: ENT;  Laterality: N/A;   MAXILLARY ANTROSTOMY N/A 12/22/2016   Procedure: LEFT MAXILLARY ANTROSTOMY REMOVAL TISSUE BIOPSY OF MASS;  Surgeon: Serena Colonel, MD;  Location: [facility];  Service: ENT;  Laterality: N/A;   NASAL  SINUS SURGERY     POLYPECTOMY     REPLACEMENT TOTAL KNEE Left 2012   REPLACEMENT TOTAL KNEE Right 2013   TOTAL ABDOMINAL HYSTERECTOMY  2000   UPPER GASTROINTESTINAL ENDOSCOPY      Allergies  Allergen Reactions   Oxycodone Nausea Only and Other (See Comments)   Paxlovid [Nirmatrelvir-Ritonavir] Shortness Of Breath   Hydrocodone Nausea And Vomiting   Penicillins Rash     Physical Exam: General: The patient is alert and oriented x3 in no acute distress.  Dermatology: Skin is warm, dry and supple bilateral lower extremities.  Elongated toenails noted to the bilateral great toe and second digit with associated dystrophy  Vascular: Palpable pedal pulses bilaterally. Capillary refill within normal limits.  No appreciable edema.  No erythema.  Neurological: Grossly intact via light touch  Musculoskeletal Exam: Tenderness throughout palpation to the right midfoot with clinical evidence of arthritis to the midfoot compared to the contralateral limb  Radiographic Exam RT foot 06/07/2023:  Degenerative changes with joint space narrowing noted to the TMT and midtarsal joints and diffusely throughout the joints of the right foot.  No acute fractures identified.    Assessment/Plan of Care:  1.  Midfoot arthritis right 2.  Dystrophic toenails bilateral great toe and second digit 3.  High arches bilateral  -Patient evaluated.  X-rays reviewed -Injection of 0.5 cc Celestone Soluspan injected into the right midfoot/midtarsal joint -Mechanical debridement of the bilateral toenails was performed today using a nail nipper.  Explained to the patient that by keeping the toenails trimmed short this should alleviate friction and irritation from shoes -OTC topical Tolcylen antifungal topical dispensed to apply daily -Continue wearing OTC arch supports and good supportive tennis shoes to support the arches of the feet -Return to clinic 4 weeks       Felecia Shelling, DPM [facility]  Dr. Felecia Shelling, DPM    [address] [place], [place] [address]                Office [phone]  Fax [phone]

## 2023-06-20 ENCOUNTER — Other Ambulatory Visit: Payer: Self-pay | Admitting: Podiatry

## 2023-07-05 ENCOUNTER — Ambulatory Visit: Payer: Medicare Other | Admitting: Podiatry

## 2023-07-12 ENCOUNTER — Ambulatory Visit: Admitting: Podiatry

## 2023-07-12 ENCOUNTER — Ambulatory Visit (INDEPENDENT_AMBULATORY_CARE_PROVIDER_SITE_OTHER): Admitting: Podiatry

## 2023-07-12 ENCOUNTER — Encounter: Payer: Self-pay | Admitting: Podiatry

## 2023-07-12 DIAGNOSIS — M19071 Primary osteoarthritis, right ankle and foot: Secondary | ICD-10-CM | POA: Diagnosis not present

## 2023-07-12 NOTE — Progress Notes (Signed)
 Chief Complaint  Patient presents with   Foot Pain    "They're doing good."    HPI: 73 y.o. female presenting today for follow-up evaluation of right midfoot pain.  She says that the injection helped significantly and she no longer has any pain or tenderness associated to the foot.  Past Medical History:  Diagnosis Date   Anal fissure    Cataract    bilateral   Chicken pox    Colon polyps    Constipation    Diverticulitis    Endometrial cancer ([facility])    Gallstones    GERD (gastroesophageal reflux disease)    Heartburn    Hiatal hernia    Hot flashes    Hyperlipidemia    Osteoarthritis    PONV (postoperative nausea and vomiting)    pt gets very nauseated, usually takes propofol for sedation   Status post dilation of esophageal narrowing    Uterine cancer ([facility])     Past Surgical History:  Procedure Laterality Date   APPENDECTOMY  2000   BREAST BIOPSY Right    BREAST CYST ASPIRATION Left    CHOLECYSTECTOMY  1984   Cornea Laser Surgery Bilateral    HEMORROIDECTOMY  1973   MASS EXCISION N/A 12/22/2016   Procedure: BIOPSY OF MASS;  Surgeon: Serena Colonel, MD;  Location: [facility];  Service: ENT;  Laterality: N/A;   MAXILLARY ANTROSTOMY N/A 12/22/2016   Procedure: LEFT MAXILLARY ANTROSTOMY REMOVAL TISSUE BIOPSY OF MASS;  Surgeon: Serena Colonel, MD;  Location: [facility];  Service: ENT;  Laterality: N/A;   NASAL SINUS SURGERY     POLYPECTOMY     REPLACEMENT TOTAL KNEE Left 2012   REPLACEMENT TOTAL KNEE Right 2013   TOTAL ABDOMINAL HYSTERECTOMY  2000   UPPER GASTROINTESTINAL ENDOSCOPY      Allergies  Allergen Reactions   Oxycodone Nausea Only and Other (See Comments)   Paxlovid [Nirmatrelvir-Ritonavir] Shortness Of Breath   Hydrocodone Nausea And Vomiting   Penicillins Rash    B/L toenails for 125  Physical Exam: General: The patient is alert and oriented x3 in no acute distress.  Dermatology: Skin is warm, dry and supple bilateral  lower extremities.  Loosely adhered dystrophic nails noted bilateral great toes.  Patient notified  Vascular: Palpable pedal pulses bilaterally. Capillary refill within normal limits.  No appreciable edema.  No erythema.  Neurological: Grossly intact via light touch  Musculoskeletal Exam: Negative for any appreciable tenderness throughout palpation to the right midfoot with clinical evidence of arthritis to the midfoot compared to the contralateral limb  Radiographic Exam RT foot 06/07/2023:  Degenerative changes with joint space narrowing noted to the TMT and midtarsal joints and diffusely throughout the joints of the right foot.  No acute fractures identified.    Assessment/Plan of Care: 1.  Midfoot arthritis right 2.  Dystrophic toenails bilateral great toe and second digit 3.  High arches bilateral  - Patient says that she never has any pain or tenderness associated to the right midfoot.  She is doing significantly better.  She is very satisfied with injections. -Continue wearing OTC arch supports and good supportive tennis shoes to support the arches of the feet -Continue OTC Tolcylen antifungal topical to the nails -Return to clinic 6 months       Felecia Shelling, DPM [facility]  Dr. Felecia Shelling, DPM    [address]  [place], [place] [address]                Office [phone]  Fax [phone]

## 2023-07-13 ENCOUNTER — Ambulatory Visit: Admitting: Podiatry

## 2023-11-08 ENCOUNTER — Other Ambulatory Visit: Payer: Self-pay | Admitting: Internal Medicine

## 2024-01-10 ENCOUNTER — Ambulatory Visit (INDEPENDENT_AMBULATORY_CARE_PROVIDER_SITE_OTHER): Admitting: Podiatry

## 2024-01-10 ENCOUNTER — Encounter: Payer: Self-pay | Admitting: Podiatry

## 2024-01-10 VITALS — Ht 66.0 in | Wt 180.0 lb

## 2024-01-10 DIAGNOSIS — M19071 Primary osteoarthritis, right ankle and foot: Secondary | ICD-10-CM

## 2024-01-10 NOTE — Progress Notes (Signed)
 Chief Complaint  Patient presents with   Nail Problem    Pt is here to f/u on Dystrophic toenails bilateral great toe and second digit, she states everything is going well, cream prescribe helps a lot, no other complaints.    HPI: 73 y.o. female presenting today for follow-up evaluation of right midfoot pain.  She says that the injection helped significantly and she no longer has any pain or tenderness associated to the foot.  Past Medical History:  Diagnosis Date   Anal fissure    Cataract    bilateral   Chicken pox    Colon polyps    Constipation    Diverticulitis    Endometrial cancer (HCC)    Gallstones    GERD (gastroesophageal reflux disease)    Heartburn    Hiatal hernia    Hot flashes    Hyperlipidemia    Osteoarthritis    PONV (postoperative nausea and vomiting)    pt gets very nauseated, usually takes propofol  for sedation   Status post dilation of esophageal narrowing    Uterine cancer (HCC)     Past Surgical History:  Procedure Laterality Date   APPENDECTOMY  2000   BREAST BIOPSY Right    BREAST CYST ASPIRATION Left    CHOLECYSTECTOMY  1984   Cornea Laser Surgery Bilateral    HEMORROIDECTOMY  1973   MASS EXCISION N/A 12/22/2016   Procedure: BIOPSY OF MASS;  Surgeon: Jesus Oliphant, MD;  Location: [facility];  Service: ENT;  Laterality: N/A;   MAXILLARY ANTROSTOMY N/A 12/22/2016   Procedure: LEFT MAXILLARY ANTROSTOMY REMOVAL TISSUE BIOPSY OF MASS;  Surgeon: Jesus Oliphant, MD;  Location: [facility];  Service: ENT;  Laterality: N/A;   NASAL SINUS SURGERY     POLYPECTOMY     REPLACEMENT TOTAL KNEE Left 2012   REPLACEMENT TOTAL KNEE Right 2013   TOTAL ABDOMINAL HYSTERECTOMY  2000   UPPER GASTROINTESTINAL ENDOSCOPY      Allergies  Allergen Reactions   Oxycodone Nausea Only and Other (See Comments)   Paxlovid [Nirmatrelvir-Ritonavir] Shortness Of Breath   Hydrocodone Nausea And Vomiting   Penicillins Rash    B/L toenails for  125  Physical Exam: General: The patient is alert and oriented x3 in no acute distress.  Dermatology: Skin is warm, dry and supple bilateral lower extremities.  Loosely adhered dystrophic nails noted bilateral great toes.  Patient notified  Vascular: Palpable pedal pulses bilaterally. Capillary refill within normal limits.  No appreciable edema.  No erythema.  Neurological: Grossly intact via light touch  Musculoskeletal Exam: Negative for any appreciable tenderness throughout palpation to the right midfoot with clinical evidence of arthritis to the midfoot compared to the contralateral limb  Radiographic Exam RT foot 06/07/2023:  Degenerative changes with joint space narrowing noted to the TMT and midtarsal joints and diffusely throughout the joints of the right foot.  No acute fractures identified.    Assessment/Plan of Care: 1.  Midfoot arthritis right 2.  Dystrophic toenails bilateral great toe and second digit 3.  High arches bilateral  - Patient says that she never has any pain or tenderness associated to midfoot.  She is doing significantly better.  She is very satisfied with injections. -Continue wearing OTC arch supports and good supportive tennis shoes to support the arches of the feet - Patient is no longer using the OTC Tolcylen that was provided.  She routinely gets pedicures and she says there has been significant improvement of the  appearance of the nail -Return to clinic PRN       Thresa EMERSON Sar, DPM [facility]  Dr. Thresa EMERSON Sar, DPM    [address] [place], [place] [address]                Office [phone]  Fax [phone]

## 2024-01-31 ENCOUNTER — Other Ambulatory Visit: Payer: Self-pay | Admitting: Internal Medicine

## 2024-03-07 ENCOUNTER — Other Ambulatory Visit: Payer: Self-pay | Admitting: Internal Medicine

## 2024-04-20 ENCOUNTER — Other Ambulatory Visit: Payer: Self-pay | Admitting: Obstetrics and Gynecology

## 2024-04-20 ENCOUNTER — Other Ambulatory Visit: Payer: Self-pay | Admitting: Internal Medicine

## 2024-04-20 ENCOUNTER — Other Ambulatory Visit: Payer: Self-pay | Admitting: Podiatry

## 2024-04-20 DIAGNOSIS — Z1231 Encounter for screening mammogram for malignant neoplasm of breast: Secondary | ICD-10-CM

## 2024-04-24 ENCOUNTER — Inpatient Hospital Stay
Admission: RE | Admit: 2024-04-24 | Discharge: 2024-04-24 | Attending: Obstetrics and Gynecology | Admitting: Obstetrics and Gynecology

## 2024-04-24 ENCOUNTER — Other Ambulatory Visit: Payer: Self-pay | Admitting: Internal Medicine

## 2024-04-24 DIAGNOSIS — Z1231 Encounter for screening mammogram for malignant neoplasm of breast: Secondary | ICD-10-CM

## 2024-04-25 ENCOUNTER — Telehealth: Payer: Self-pay | Admitting: Internal Medicine

## 2024-04-25 MED ORDER — OMEPRAZOLE 40 MG PO CPDR: 40.0000 mg | DELAYED_RELEASE_CAPSULE | Freq: Every day | ORAL | 0 refills | Status: AC

## 2024-04-25 MED ORDER — LINACLOTIDE 290 MCG PO CAPS: ORAL_CAPSULE | ORAL | 0 refills | Status: AC

## 2024-04-25 NOTE — Telephone Encounter (Signed)
 Inbound call from patient stating she needs refill on medications linzess  and omeprazole . Patient is scheduled for a follow up on 05/23/24 Please advise  Thank you

## 2024-04-25 NOTE — Telephone Encounter (Signed)
 Omeprazole  40 mg and Linzess  290 mcg refills sent to pharmacy. Patient has an appointment on Feb 11th with Colleen.

## 2024-05-01 ENCOUNTER — Other Ambulatory Visit: Payer: Self-pay | Admitting: Obstetrics and Gynecology

## 2024-05-01 DIAGNOSIS — Z803 Family history of malignant neoplasm of breast: Secondary | ICD-10-CM

## 2024-05-23 ENCOUNTER — Ambulatory Visit: Admitting: Nurse Practitioner

## 2024-06-06 ENCOUNTER — Other Ambulatory Visit
